# Patient Record
Sex: Male | Born: 1960 | Race: White | Hispanic: No | Marital: Married | Smoking: Never smoker
Health system: Southern US, Academic
[De-identification: ages and names within clinical notes are randomized; demographics above are authoritative.]

## PROBLEM LIST (undated history)

## (undated) DIAGNOSIS — E119 Type 2 diabetes mellitus without complications: Secondary | ICD-10-CM

## (undated) DIAGNOSIS — I10 Essential (primary) hypertension: Secondary | ICD-10-CM

## (undated) DIAGNOSIS — Z973 Presence of spectacles and contact lenses: Secondary | ICD-10-CM

## (undated) DIAGNOSIS — E785 Hyperlipidemia, unspecified: Secondary | ICD-10-CM

## (undated) HISTORY — PX: SHOULDER SURGERY: SHX246

## (undated) HISTORY — DX: Type 2 diabetes mellitus without complications: E11.9

---

## 2020-10-16 ENCOUNTER — Ambulatory Visit: Attending: Sports Medicine | Admitting: Sports Medicine

## 2020-10-16 ENCOUNTER — Ambulatory Visit (HOSPITAL_BASED_OUTPATIENT_CLINIC_OR_DEPARTMENT_OTHER)
Admission: RE | Admit: 2020-10-16 | Discharge: 2020-10-16 | Disposition: A | Discharge status: Home / Self Care | Source: Ambulatory Visit | Admitting: Radiology

## 2020-10-16 ENCOUNTER — Other Ambulatory Visit: Payer: Self-pay

## 2020-10-16 ENCOUNTER — Encounter (HOSPITAL_BASED_OUTPATIENT_CLINIC_OR_DEPARTMENT_OTHER): Payer: Self-pay | Admitting: Sports Medicine

## 2020-10-16 VITALS — BP 138/75 | HR 85 | Temp 96.8°F | Ht 72.52 in | Wt 204.1 lb

## 2020-10-16 DIAGNOSIS — M19011 Primary osteoarthritis, right shoulder: Secondary | ICD-10-CM

## 2020-10-16 DIAGNOSIS — S43439A Superior glenoid labrum lesion of unspecified shoulder, initial encounter: Secondary | ICD-10-CM | POA: Insufficient documentation

## 2020-10-16 DIAGNOSIS — M67813 Other specified disorders of tendon, right shoulder: Secondary | ICD-10-CM | POA: Insufficient documentation

## 2020-10-16 DIAGNOSIS — S43431A Superior glenoid labrum lesion of right shoulder, initial encounter: Secondary | ICD-10-CM

## 2020-10-16 DIAGNOSIS — M25511 Pain in right shoulder: Secondary | ICD-10-CM

## 2020-10-16 MED ORDER — NAPROXEN 500 MG TABLET
500.0000 mg | ORAL_TABLET | Freq: Two times a day (BID) | ORAL | 0 refills | Status: DC
Start: 2020-10-16 — End: 2020-10-20

## 2020-10-20 ENCOUNTER — Other Ambulatory Visit (HOSPITAL_BASED_OUTPATIENT_CLINIC_OR_DEPARTMENT_OTHER): Payer: Self-pay | Admitting: Sports Medicine

## 2020-11-10 NOTE — Progress Notes (Signed)
[facility]   DEPARTMENT OF ORTHOPAEDICS  HISTORY AND PHYSICAL    Patient: Craig Pierce  MRN: [id number]  Date of Service: 11/10/20  Date of Birth: June 10, 1961  Age: 60 y.o.      CHIEF COMPLAINT  Right shoulder pain    HISTORY OF PRESENT ILLNESS  Craig Pierce presents to clinic today for initial evaluation of his right shoulder.  Patient reports he slipped on ice and fell hit his back in shoulder and elbow this occurred on August 12, 2020.  Patient reports that he has pain in the area of the anterior shoulder he denies any new falls trips or injury he reports his pain is approximately a 7/10 he has not completed any physical therapy denies any taking any anti-inflammatories at this point.  He reports that he has been working light duty.    MEDICATIONS  aspirin 81 mg Oral Capsule, Take 81 mg by mouth Once a day  empagliflozin (JARDIANCE) 25 mg Oral Tablet, Take 25 mg by mouth Once a day  losartan (COZAAR) 100 mg Oral Tablet, Take 100 mg by mouth Once a day  MetFORMIN (GLUCOPHAGE) 1,000 mg Oral Tablet, Take 1,000 mg by mouth Twice daily with food  multivitamin-minerals-lutein (MULTIVITAMIN 50 PLUS) Oral Tablet, Take 1 Tablet by mouth Once a day  vit  A,C,E-Zinc-Copper (ICAPS AREDS) 14,320-226-200 unit-mg-unit Oral Capsule, Take 1 Capsule by mouth Twice daily    No facility-administered medications prior to visit.        ALLERGIES  Patient reports allergies to penicillin    PAST MEDICAL HISTORY  Patient reports past medical history significant for diabetes with an A1c of 7.3    PAST SURGICAL HISTORY  Patient reports past surgical history significant for cholecystectomy colonoscopy and plates placed in the pelvis.  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  He denies any smoking does report chewing tobacco denies alcohol use    FAMILY HISTORY  Patient reports family history significant for Crohn's disease sleep apnea diabetes high  cholesterol    REVIEW OF SYSTEMS  Constitutional: denies  fevers or night sweats  Skin: denis lesions and rashes   Cardiovascular: denies chest pain, history of arrhythmia, history of MI, history of a murmur  Respiratory: denies shortness of breath, cough  Gastrointestinal: denies abdominal pain, nausea, vomiting, or diarrhea   Genitourinary: denies incontinence, dysuria,  Neurological: denies tics, tremors, seizures   Psychiatric: denies history of depression, thoughts of harm to self or others   Vascular: denies history of a DVT, claudication,   Musculoskeletal: SEE HPI     All other systems reviewed and negative    PHYSICAL EXAMINATION   BP 138/75   Pulse 85   Temp 36 C (96.8 F) (Thermal Scan)   Ht 1.842 m (6' 0.52")   Wt 92.6 kg (204 lb 2.3 oz)   BMI 27.29 kg/m       GEN:   On physical exam is a 60 year old male with appropriate mood affect in no apparent distress  SKIN: Warm and Dry without lesions or rashes   NECK: Neck symmetrical and trachea midline.  CV:   Radial ulnar pulses 2+.   PULM:   Unlabored breathing on room air   MS:   Exam of the right shoulder patient shows no previous surgical scars there is no erythema or ecchymosis active range of motion from 0 to approximately 100 20 of forward flexion external rotation  of 30 internal rotation L4-L5 with a positive O'Brien's  NEURO: Sensation intact distally   PSYCHOSOCIAL:   Alert and Oriented to person, place and time with appropriate mood and affect.     Skin: Warm and Dry         RADIOGRAPHS  X-rays of the patient's right shoulder show no acute bony abnormality  MRI the patient's right shoulder does show SLAP tear    ASSESSMENT   Right shoulder slap tear    PLAN  At this point we explained the patient we given a course of physical therapy working on strengthening and range of motion and see how he progresses he was understanding of this and did wish to proceed in this fashion all questions concerns were answered if he has any further questions  or concerns he can feel free to call us otherwise we will see him back in 6 weeks    A shared visit was performed with the co-signing physician.      Timmie Dugue Katrinka Blazing, PA-C 11/10/2020, 08:05     E. Letitia Caul, MD  Associate Professor  [facility] Department of Orthopaedics    I personally saw and examined the patient. See mid-level's note for additional details. My findings are consistant with Right shoulder pain    SLAP tear of shoulder    Biceps tendonosis of right shoulder.    Harrison Mons, MD 11/13/2020, 07:49

## 2020-11-13 ENCOUNTER — Ambulatory Visit: Attending: Sports Medicine | Admitting: Sports Medicine

## 2020-11-13 ENCOUNTER — Encounter (HOSPITAL_BASED_OUTPATIENT_CLINIC_OR_DEPARTMENT_OTHER): Payer: Self-pay | Admitting: Sports Medicine

## 2020-11-13 ENCOUNTER — Other Ambulatory Visit: Payer: Self-pay

## 2020-11-13 VITALS — BP 139/83 | HR 85 | Temp 97.7°F

## 2020-11-13 DIAGNOSIS — S43431D Superior glenoid labrum lesion of right shoulder, subsequent encounter: Secondary | ICD-10-CM

## 2020-11-13 DIAGNOSIS — S43439A Superior glenoid labrum lesion of unspecified shoulder, initial encounter: Secondary | ICD-10-CM | POA: Insufficient documentation

## 2020-11-13 DIAGNOSIS — G252 Other specified forms of tremor: Secondary | ICD-10-CM | POA: Insufficient documentation

## 2020-11-13 NOTE — Progress Notes (Signed)
[facility]   DEPARTMENT OF ORTHOPAEDICS    PATIENT NAME:Craig Pierce  MRN:  [id number]  DOB:  24-Nov-1960  DATE: 11/13/2020    Chief Complaint:  Right shoulder SLAP tear    Subjective: Craig Pierce presents to clinic today for follow-up evaluations right shoulder.  Patient reports he has been completing physical therapy he reports that his symptoms are about the same he does not have that much in the way of anterior shoulder pain he does report occasional pain across the anterior aspect of the shoulder but his major complaint today is a tremor that occurs when he is reaching out towards objects.  Patient reports that he gets closer to his endpoint he has an increasing tremor.  He has difficulty turning on and off his CBD radial and he has difficulty with drinks as well.      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 denies any smoking chewing tobacco or alcohol use at this time    Review of Systems:  Constitutional: denies  fevers or night sweats  Skin: denies lesions and rashes   Cardiovascular: denies chest pain, history of arrhythmia, history of MI, history of a murmur  Respiratory: denies shortness of breath, cough  Gastrointestinal: denies abdominal pain, nausea, vomiting, or diarrhea   Genitourinary: denies incontinence, dysuria,  Neurological: denies tics, tremors, seizures   Psychiatric: denies history of depression, thoughts of harm to self or others   Vascular: denies history of a DVT, claudication,   Musculoskeletal: SEE HPI     Objective:  BP 139/83   Pulse 85   Temp 36.5 C (97.7 F) (Thermal Scan)       GEN:   On physical exam this is a 60 year old male with appropriate mood affect in no apparent distress  NECK: Neck symmetrical and trachea midline.  CV:  Radial ulnar pulses 2+ present bilaterally, without pedal edema.   PULM:   Unlabored breathing on room air   ABD: Non distended.  MS:  Examination the patient's right shoulder shows no previous surgical scars there is no erythema or ecchymosis present on today's exam active range of motion from 0-160 degrees of forward flexion external rotation around 30 internal rotation L4-L5 5/5 strength with external and internal rotation with a positive O'Brien's test.   He does show a clear tremor with finger-to-nose testing.  NEURO: Sensation intact distally   PSYCHOSOCIAL:   Alert and Oriented to person, place and time with appropriate mood and affect.     Skin: Warm and Dry        Assessment:   Right shoulder slap tear  Intention tremor    Plan:   At this point we explained to the patient that locked he does have a slap tear we would have him continue physical therapy for that we would like him evaluated for this intention tremor as that is new in onset we did place referral to neurology we explained this may have to be done outside of workers compensation.     A shared visit was performed with the co-signing physician.         Kaiyu Mirabal Katrinka Blazing, PA-C 11/13/2020, 15:09    E. Letitia Caul, MD  Associate Professor  [facility] Department of Orthopaedics        I personally saw  and examined the patient. See mid-level's note for additional details. My findings are consistant with SLAP tear of shoulder    Intention tremor.    Harrison Mons, MD 11/17/2020, 07:43

## 2020-11-20 ENCOUNTER — Encounter (HOSPITAL_BASED_OUTPATIENT_CLINIC_OR_DEPARTMENT_OTHER): Payer: Self-pay | Admitting: Sports Medicine

## 2020-11-24 ENCOUNTER — Encounter (HOSPITAL_BASED_OUTPATIENT_CLINIC_OR_DEPARTMENT_OTHER): Payer: Self-pay | Admitting: Sports Medicine

## 2020-12-04 ENCOUNTER — Ambulatory Visit (INDEPENDENT_AMBULATORY_CARE_PROVIDER_SITE_OTHER): Payer: Self-pay | Admitting: Neurology

## 2020-12-04 NOTE — Telephone Encounter (Signed)
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   Orthopedics is referring.  I thought it had to be from a [facility] PCP or Occ [facility].  Will we see him?    Thanks,  Lupita Leash

## 2020-12-04 NOTE — Telephone Encounter (Signed)
Spoke with Craig Pierce- pt is w/c-   Referring is ortho--Needs to be Occupational med or PCP  Tremor didn't start till after accident. Should shoulder be fixed first  Please advise

## 2020-12-07 ENCOUNTER — Ambulatory Visit (INDEPENDENT_AMBULATORY_CARE_PROVIDER_SITE_OTHER): Payer: Self-pay | Admitting: Neurology

## 2020-12-07 NOTE — Telephone Encounter (Signed)
Appt cancelled for today - wife said surgeon is going to do shoulder surgery

## 2020-12-07 NOTE — Telephone Encounter (Signed)
Tremor unlikely to be work related

## 2020-12-21 ENCOUNTER — Other Ambulatory Visit (HOSPITAL_BASED_OUTPATIENT_CLINIC_OR_DEPARTMENT_OTHER): Payer: Self-pay | Admitting: PHYSICIAN ASSISTANT

## 2020-12-21 MED ORDER — NAPROXEN 500 MG TABLET
500.0000 mg | ORAL_TABLET | Freq: Two times a day (BID) | ORAL | 0 refills | Status: DC
Start: 2020-12-21 — End: 2021-02-22

## 2021-01-06 ENCOUNTER — Other Ambulatory Visit (HOSPITAL_BASED_OUTPATIENT_CLINIC_OR_DEPARTMENT_OTHER): Payer: Self-pay | Admitting: PHYSICIAN ASSISTANT

## 2021-01-06 NOTE — Progress Notes (Addendum)
NAME: Craig Pierce  MRN: [id number]  DOB: 13-Jan-1961  DATE: 01/06/2021        [name] consulted with neurology about his new on set complaints of tremors. Neurology does not recommend any intervention at this time.He does have symptoms consistent with a slap tear and as neurology does not recommend any interventions we would recommend moving forward with a right shoulder arthroscopy with biceps tenodesis vs tenolysis.     If you have any other questions or concerns, please feel free to contact our office at [phone].      Lorisa Scheid Katrinka Blazing, PA-C 01/06/2021, 15:14    With    E. Letitia Caul, MD  Associate Professor  [facility] Department of Orthopaedics

## 2021-01-18 ENCOUNTER — Other Ambulatory Visit (HOSPITAL_BASED_OUTPATIENT_CLINIC_OR_DEPARTMENT_OTHER): Payer: Self-pay | Admitting: PHYSICIAN ASSISTANT

## 2021-01-22 ENCOUNTER — Encounter (HOSPITAL_BASED_OUTPATIENT_CLINIC_OR_DEPARTMENT_OTHER): Admitting: Sports Medicine

## 2021-01-22 ENCOUNTER — Encounter (HOSPITAL_BASED_OUTPATIENT_CLINIC_OR_DEPARTMENT_OTHER): Payer: Self-pay

## 2021-02-09 ENCOUNTER — Other Ambulatory Visit: Payer: Self-pay

## 2021-02-09 ENCOUNTER — Ambulatory Visit: Attending: Sports Medicine | Admitting: Sports Medicine

## 2021-02-09 ENCOUNTER — Inpatient Hospital Stay (HOSPITAL_BASED_OUTPATIENT_CLINIC_OR_DEPARTMENT_OTHER)
Admission: RE | Admit: 2021-02-09 | Discharge: 2021-02-09 | Disposition: A | Discharge status: Home / Self Care | Source: Ambulatory Visit

## 2021-02-09 ENCOUNTER — Other Ambulatory Visit (HOSPITAL_BASED_OUTPATIENT_CLINIC_OR_DEPARTMENT_OTHER): Payer: Self-pay | Admitting: PHYSICIAN ASSISTANT

## 2021-02-09 ENCOUNTER — Encounter (HOSPITAL_BASED_OUTPATIENT_CLINIC_OR_DEPARTMENT_OTHER): Payer: Self-pay

## 2021-02-09 ENCOUNTER — Inpatient Hospital Stay (HOSPITAL_BASED_OUTPATIENT_CLINIC_OR_DEPARTMENT_OTHER)
Admission: RE | Admit: 2021-02-09 | Discharge: 2021-02-09 | Disposition: A | Discharge status: Home / Self Care | Source: Ambulatory Visit | Attending: Sports Medicine | Admitting: Sports Medicine

## 2021-02-09 VITALS — BP 140/83 | HR 77 | Temp 96.4°F | Ht 72.56 in | Wt 204.1 lb

## 2021-02-09 DIAGNOSIS — S43431A Superior glenoid labrum lesion of right shoulder, initial encounter: Secondary | ICD-10-CM | POA: Insufficient documentation

## 2021-02-09 DIAGNOSIS — S43439A Superior glenoid labrum lesion of unspecified shoulder, initial encounter: Secondary | ICD-10-CM

## 2021-02-09 DIAGNOSIS — Z01818 Encounter for other preprocedural examination: Secondary | ICD-10-CM

## 2021-02-09 DIAGNOSIS — S43431D Superior glenoid labrum lesion of right shoulder, subsequent encounter: Secondary | ICD-10-CM

## 2021-02-09 HISTORY — DX: Essential (primary) hypertension: I10

## 2021-02-09 HISTORY — DX: Presence of spectacles and contact lenses: Z97.3

## 2021-02-09 HISTORY — DX: Hyperlipidemia, unspecified: E78.5

## 2021-02-09 HISTORY — DX: Type 2 diabetes mellitus without complications: E11.9

## 2021-02-09 LAB — POC BLOOD GLUCOSE (RESULTS): GLUCOSE, POC: 232 mg/dl — ABNORMAL HIGH (ref 70–105)

## 2021-02-09 NOTE — Patient Instructions (Signed)
[place] Department of Orthopaedics  Office Phone:  [phone]  Scheduling Phone:  [phone]    SHOULDER ARTHROSCOPY  POST-OP INSTRUCTIONS  DAY OF THE PROCEDURE:     . You are to have nothing to eat or drink after midnight they day of your procedure.   . This can prevent your case from being moved if someone cancels there procedure and can result in the cancellation of your procedure.     MEDICATION:    Take medication ONLY as prescribed.  ? Percocet (Oxycodone/ Acetaminophen):  1 tablet every 4-6 hours as needed for pain  ? Stool Softener as needed (Colace, Senokot, etc.)  ? * DO NOT take additional Tylenol/ Acetaminophen* (as this is already in pain medicine that you were prescribed)      ACTIVITY:     . Apply ice or cryo-cuff (ice machine) over the shoulder every 2 to 3 hours for 20 minutes at a time.  DO NOT place ice directly onto your skin.  Continue this for 1 week or until your pain has subsided. DO NOT use heat.    . Place a pillow behind your elbow while lying down or sleeping.  Sleeping in a              upright position (recliner) may be more comfortable initially.    Marland Kitchen Open and close your hand 10 times every hour that you are awake.  Flex and extend your wrist and elbow 10 times every hour that you are awake.  It is OK to remove your sling for elbow and wrist range of motion only.    . May do light activity around the house.  No lifting/ pushing/ pulling with operative arm.    . DRIVING:  You MAY NOT drive until your next office visit.  You will not be permitted to drive until you are off of all narcotic pain medication and your physician has discontinued use of your shoulder sling.    . DO NOT use exercise equipment unless otherwise instructed.        SHOULDER SLING:    ? Use the shoulder sling at all times and while sleeping until your next office visit. Only come out of it to work on elbow, wrist and hand motion. No lifting.       DRESSING CARE:     . Keep the dressing dry.  Continue to wear the  compression stockings for 3     weeks after the operation.    . You can expect some light bloody wound seepage through the bandage.  DO NOT BE ALARMED.  Fluid seepage is normal.  If the dressing does get soaked, remove and replace with dry gauze and a new bandage.      . Your first dressing change can be done at Physical Therapy or at home if you are comfortable doing it.  This will be performed 72 HOURS (approximately 3 days) after the surgery.  DO NOT remove the stitches or paper tape (steri-strips).  Re-apply the shoulder sling.      SHOWERING:    .   You may begin to shower 3 DAYS after the surgery once the dressing has been changed.  DO NOT soak the shoulder/ arm in a bathtub, hot tub, whirlpool, or swimming in the ocean / pool. Remove the brace/ sling and bandages for showering.  You should either sit on a chair or steady yourself with handrails. DO NOT REMOVE THE STREI STRIPS, LET THEM FALL OFF  ON THEIR OWN.      PHYSICAL THERAPY:    .  You should be seen in Physical Therapy 3 days after surgery.  TAKE YOUR PHYSICAL THERAPY PRESCRIPTION/ PROTOCOL WITH YOU TO THAT APPOINTMENT.  They will change your dressing, begin rehabilitation, and schedule further therapy sessions.  Do not perform any exercises without clearance from physical therapy.  You should contact the Physical Therapy clinic PRIOR to surgery to arrange your appointment for 3 days after surgery.  FOLLOW-UP:     . Your post-operative appointment is scheduled for .     Marland Kitchen Notify the office if you have:  1.) any fever over 101.5 degrees.  2.) Severe calf tenderness.  3.) Excessive bloody wound seepage.  4.) Numbness in the arm/ hand.

## 2021-02-09 NOTE — Progress Notes (Signed)
[facility]   DEPARTMENT OF ORTHOPAEDICS  HISTORY AND PHYSICAL    Patient: Craig Pierce  MRN: [id number]  Date of Service: 02/09/21  Date of Birth: [birth year]  Age: 60 y.o.      CHIEF COMPLAINT   right shoulder SLAP tear    HISTORY OF PRESENT ILLNESS  Craig Pierce presents to clinic today for preoperative history and physical prior to a right shoulder arthroscopy with biceps tenodesis.  Patient reports he was injured on August 12, 2020 at which time he slipped on ice fell and he has elbow and shoulder.  He reports he had immediate pain and he has continued to have anterior shoulder pain since that time he denies any new falls trips or injuries he denies any numbness or tingling he does report that he developed a tremor of his occurring when he was trying to use his CB radial or taken drink we did report him that we do not think that we will make the symptoms better but we are hoping that we will assist with any anterior shoulder pain he may have.  He has completed physical therapy and he has had no success with conservative treatment.    MEDICATIONS  aspirin 81 mg Oral Capsule, Take 81 mg by mouth Once a day  empagliflozin (JARDIANCE) 25 mg Oral Tablet, Take 25 mg by mouth Once a day  losartan (COZAAR) 100 mg Oral Tablet, Take 100 mg by mouth Once a day  MetFORMIN (GLUCOPHAGE) 1,000 mg Oral Tablet, Take 1,000 mg by mouth Twice daily with food  multivitamin-minerals-lutein (MULTIVITAMIN 50 PLUS) Oral Tablet, Take 1 Tablet by mouth Once a day  naproxen (NAPROSYN) 500 mg Oral Tablet, Take 1 Tablet (500 mg total) by mouth Twice daily with food  vit  A,C,E-Zinc-Copper (ICAPS AREDS) 14,320-226-200 unit-mg-unit Oral Capsule, Take 1 Capsule by mouth Twice daily    No facility-administered medications prior to visit.        ALLERGIES  Patient reports allergies to penicillin    PAST MEDICAL HISTORY  Patient reports past medical history significant for diabetes    PAST SURGICAL  HISTORY  Patient reports past surgical history is significant for cholecystectomy, colonoscopy, plates placed in pelvis.  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  Denies any smoking or chewing tobacco.    FAMILY HISTORY  Patient reports family history is significant for Crohn's disease, sleep apnea, diabetes, high cholesterol    REVIEW OF SYSTEMS  Constitutional: denies  fevers or night sweats  Skin: denies lesions and rashes   Cardiovascular: denies chest pain, history of arrhythmia, history of MI, history of a murmur  Respiratory: denies shortness of breath, cough  Gastrointestinal: denies abdominal pain, nausea, vomiting, or diarrhea   Genitourinary: denies incontinence, dysuria,  Neurological: denies tics, tremors, seizures   Psychiatric: denies history of depression, thoughts of harm to self or others   Vascular: denies history of a DVT, claudication,   Musculoskeletal: SEE HPI     All other systems reviewed and negative    PHYSICAL EXAMINATION   BP (!) 140/83   Pulse 77   Temp 35.8 C (96.4 F) (Thermal Scan)   Ht 1.843 m (6' 0.56")   Wt 92.6 kg (204 lb 2.3 oz)   BMI 27.26 kg/m       GEN:   On physical exam this is a 60 year old male with appropriate mood affect in no apparent distress  SKIN: Warm  and Dry without lesions or rashes   NECK: Neck symmetrical and trachea midline.  CV:   Posterior tibial and dorsal pedal pulses present bilaterally, with no pedal edema.   PULM:   Unlabored breathing on room air   MS:   On examination of the patient's right shoulder he shows no previous surgical scars there is no erythema or ecchymosis present on today's exam he has tenderness to palpation across the anterior glenohumeral joint denies any tenderness to palpation across the posterior glenohumeral joint or scapula and he has active range of motion from 0-160 degrees of forward flexion external rotation around 30 internal rotation L4-L5 with a positive O'Brien's  test.  NEURO: Sensation intact distally   PSYCHOSOCIAL:   Alert and Oriented to person, place and time with appropriate mood and affect.     Skin: Warm and Dry         RADIOGRAPHS  MRI the patient's right shoulder does show a SLAP tear.    ASSESSMENT   Right shoulder SLAP tear    PLAN  Craig Pierce is scheduled to undergo a right shoulder(s) arthroscopy with biceps tenodesis.  At today's visit, he was explained the procedure as well alternative procedures and benefits versus risks including but not limited to infection, bleeding, continued pain, stiffness, blood clots or need for further surgery.  Patient understood these risks and agreed to sign the consent form. It is my belief that the patient fully understands the procedure and risks involved with this surgery. he was instructed not to eat or drink anything after midnight the night before surgery.  As part of today's pre-operative visit we also discussed the risk of opoid addiction associated with Craig Pierce's post operative medication Percocet. We reviewed the CSMP for any current or previously prescribed medications. We discussed that this medication must be taken as prescribed. It is not to be taken with any alcohol or mood altering drugs. We discussed transitioning to an over-the-counter pain medication as quickly as possible. It was explained that should this medication be lost, stolen or damaged it will not be replaced, for this reason we explained the importance of keeping this prescription in a secure and safe location. We explained should a refill need provided this refill will deescalate in strength and or duration. Craig Pierce signed a pain contract at today's visit.   He was sent to pre-admission testing today.  All of his questions were answered in clinic.  He was not given any prescriptions at today's visit.  They were provided with their post-op instruction and a script for physical therapy to be started 3-4 days after surgery today.  We will see  Craig Pierce back for the aforementioned procedure.      A shared visit was performed with the co-signing physician.    Craig Deckman Katrinka Blazing, PA-C 02/09/2021, 13:56     E. Letitia Caul, MD  Associate Professor  [facility] Department of Orthopaedics    I personally saw and examined the patient. See mid-level's note for additional details. My findings are consistant with Superior labrum anterior-to-posterior (SLAP) tear of right shoulder.    Harrison Mons, MD 02/12/2021, 08:00

## 2021-02-09 NOTE — Anesthesia Preprocedure Evaluation (Addendum)
ANESTHESIA PRE-OP EVALUATION  Planned Procedure: ARTHROSCOPY SHOULDER (Right Shoulder)  OPEN SUBPECTORAL BICEPS TENODESIS (Right Shoulder)  Review of Systems     anesthesia history negative     patient summary reviewed          Pulmonary   Smokeless tobacco, last use w/in 24 hours,   Cardiovascular    Hypertension, well controlled, ACE / ARB inhibitor use and hyperlipidemia , Exercise Tolerance: > or = 4 METS        GI/Hepatic/Renal   negative GI/hepatic/renal ROS,         Endo/Other      type 2 diabetes/ controlled with oral medications    Neuro/Psych/MS   negative neuro/psych ROS,      Cancer    negative hematology/oncology ROS,                 Physical Assessment      Airway       Mallampati: I    TM distance: >3 FB    Neck ROM: full  Mouth Opening: fair.  Facial hair  Beard        Dental           (+) edentulous           Pulmonary    Comment: Smokeless tobacco, last use w/in 24 hours  Breath sounds clear to auscultation       Cardiovascular    Rhythm: regular  Rate: Normal       Other findings            Plan  ASA 3     Planned anesthesia type: general     general anesthesia with endotracheal tube intubation            Additional Plans: Pre-op Block      Intravenous induction       Anesthetic plan and risks discussed with patient  Signed consent obtained        Use of blood products discussed with patient who consented to blood products.     Patient's NPO status is appropriate for Anesthesia.                        Consults: medical clearance scanned into Careers information officer with labs, EKG, CXR.    Patient instructed to take the following medications day of surgery, none.    Hold metformin day prior and am of surgery    Hold jardiance 3 days prior to surgery    Hold cozaar am of procedure    Blood thinners per service instructions.  Aspirin- service inboxed, pt states he was not instructed on preop aspirin use.    Instructed to hold vitamins, herb and NSAIDs 1 week preop.    Copy of Anesthesia Consent provided to  patient or guardian to review prior to procedure. Pt instructed that consent will be signed prior to procedure with anesthesiologist.      Rayford Halsted, DO   CA-1, PGY-2, Anesthesiology  02/22/2021 10:26

## 2021-02-22 ENCOUNTER — Encounter (HOSPITAL_COMMUNITY)
Admission: RE | Disposition: A | Discharge status: Home / Self Care | Payer: Self-pay | Source: Ambulatory Visit | Attending: Sports Medicine

## 2021-02-22 ENCOUNTER — Ambulatory Visit (HOSPITAL_BASED_OUTPATIENT_CLINIC_OR_DEPARTMENT_OTHER): Admitting: Family

## 2021-02-22 ENCOUNTER — Other Ambulatory Visit (HOSPITAL_COMMUNITY)

## 2021-02-22 ENCOUNTER — Inpatient Hospital Stay
Admission: RE | Admit: 2021-02-22 | Discharge: 2021-02-22 | Disposition: A | Discharge status: Home / Self Care | Source: Ambulatory Visit | Attending: Sports Medicine | Admitting: Sports Medicine

## 2021-02-22 ENCOUNTER — Ambulatory Visit (HOSPITAL_COMMUNITY): Admitting: Family

## 2021-02-22 ENCOUNTER — Encounter (HOSPITAL_COMMUNITY): Payer: Self-pay | Admitting: Sports Medicine

## 2021-02-22 ENCOUNTER — Other Ambulatory Visit: Payer: Self-pay

## 2021-02-22 ENCOUNTER — Ambulatory Visit (HOSPITAL_COMMUNITY): Admitting: Student in an Organized Health Care Education/Training Program

## 2021-02-22 ENCOUNTER — Encounter (HOSPITAL_BASED_OUTPATIENT_CLINIC_OR_DEPARTMENT_OTHER): Admitting: Sports Medicine

## 2021-02-22 ENCOUNTER — Ambulatory Visit (HOSPITAL_BASED_OUTPATIENT_CLINIC_OR_DEPARTMENT_OTHER): Admitting: Student in an Organized Health Care Education/Training Program

## 2021-02-22 DIAGNOSIS — S43431A Superior glenoid labrum lesion of right shoulder, initial encounter: Secondary | ICD-10-CM | POA: Insufficient documentation

## 2021-02-22 DIAGNOSIS — S46011A Strain of muscle(s) and tendon(s) of the rotator cuff of right shoulder, initial encounter: Secondary | ICD-10-CM

## 2021-02-22 DIAGNOSIS — W000XXA Fall on same level due to ice and snow, initial encounter: Secondary | ICD-10-CM | POA: Insufficient documentation

## 2021-02-22 DIAGNOSIS — Y990 Civilian activity done for income or pay: Secondary | ICD-10-CM | POA: Insufficient documentation

## 2021-02-22 DIAGNOSIS — G8918 Other acute postprocedural pain: Secondary | ICD-10-CM

## 2021-02-22 DIAGNOSIS — M25511 Pain in right shoulder: Secondary | ICD-10-CM

## 2021-02-22 DIAGNOSIS — S46111A Strain of muscle, fascia and tendon of long head of biceps, right arm, initial encounter: Secondary | ICD-10-CM | POA: Insufficient documentation

## 2021-02-22 LAB — POC BLOOD GLUCOSE (RESULTS)
GLUCOSE, POC: 202 mg/dl — ABNORMAL HIGH (ref 70–105)
GLUCOSE, POC: 183 mg/dl — ABNORMAL HIGH (ref 70–105)

## 2021-02-22 SURGERY — ARTHROSCOPY SHOULDER
Anesthesia: General | Site: Shoulder | Laterality: Right | Wound class: Clean Wound: Uninfected operative wounds in which no inflammation occurred

## 2021-02-22 MED ORDER — DEXAMETHASONE SODIUM PHOSPHATE 4 MG/ML INJECTION SOLUTION
Freq: Once | INTRAMUSCULAR | Status: DC | PRN
Start: 2021-02-22 — End: 2021-02-22
  Administered 2021-02-22: 4 mg via INTRAVENOUS

## 2021-02-22 MED ORDER — CYCLOBENZAPRINE 5 MG TABLET
5.0000 mg | ORAL_TABLET | Freq: Three times a day (TID) | ORAL | 0 refills | Status: AC | PRN
Start: 2021-02-22 — End: 2021-03-01
  Filled 2021-02-22: qty 21, 7d supply, fill #0

## 2021-02-22 MED ORDER — SODIUM CHLORIDE 0.9 % (FLUSH) INJECTION SYRINGE
2.0000 mL | INJECTION | Freq: Three times a day (TID) | INTRAMUSCULAR | Status: DC
Start: 2021-02-22 — End: 2021-02-22

## 2021-02-22 MED ORDER — LACTATED RINGERS INTRAVENOUS SOLUTION
INTRAVENOUS | Status: DC
Start: 2021-02-22 — End: 2021-02-22

## 2021-02-22 MED ORDER — ROPIVACAINE (PF) 5 MG/ML (0.5 %) INJECTION SOLUTION
30.0000 mL | Freq: Once | INTRAMUSCULAR | Status: DC | PRN
Start: 2021-02-22 — End: 2021-02-22
  Administered 2021-02-22: 20 mL via INTRAMUSCULAR

## 2021-02-22 MED ORDER — FENTANYL (PF) 50 MCG/ML INJECTION SOLUTION
25.0000 ug | INTRAMUSCULAR | Status: DC | PRN
Start: 2021-02-22 — End: 2021-02-22

## 2021-02-22 MED ORDER — BACITRACIN 500 UNIT/G OINTMENT TUBE
TOPICAL_OINTMENT | Freq: Once | CUTANEOUS | Status: DC | PRN
Start: 2021-02-22 — End: 2021-02-22

## 2021-02-22 MED ORDER — SODIUM CHLORIDE 0.9 % (FLUSH) INJECTION SYRINGE
2.0000 mL | INJECTION | INTRAMUSCULAR | Status: DC | PRN
Start: 2021-02-22 — End: 2021-02-22

## 2021-02-22 MED ORDER — LIDOCAINE (PF) 100 MG/5 ML (2 %) INTRAVENOUS SYRINGE
INJECTION | Freq: Once | INTRAVENOUS | Status: DC | PRN
Start: 2021-02-22 — End: 2021-02-22
  Administered 2021-02-22 (×2): 100 mg via INTRAVENOUS

## 2021-02-22 MED ORDER — SUGAMMADEX 100 MG/ML INTRAVENOUS SOLUTION
Freq: Once | INTRAVENOUS | Status: DC | PRN
Start: 2021-02-22 — End: 2021-02-22
  Administered 2021-02-22: 200 mg via INTRAVENOUS

## 2021-02-22 MED ORDER — ONDANSETRON HCL (PF) 4 MG/2 ML INJECTION SOLUTION
Freq: Once | INTRAMUSCULAR | Status: DC | PRN
Start: 2021-02-22 — End: 2021-02-22
  Administered 2021-02-22: 4 mg via INTRAVENOUS

## 2021-02-22 MED ORDER — HYDROCODONE 5 MG-ACETAMINOPHEN 325 MG TABLET
1.0000 | ORAL_TABLET | ORAL | 0 refills | Status: AC | PRN
Start: 2021-02-22 — End: 2021-02-27
  Filled 2021-02-22: qty 30, 5d supply, fill #0

## 2021-02-22 MED ORDER — ROCURONIUM 10 MG/ML INTRAVENOUS SOLUTION
Freq: Once | INTRAVENOUS | Status: DC | PRN
Start: 2021-02-22 — End: 2021-02-22
  Administered 2021-02-22: 50 mg via INTRAVENOUS

## 2021-02-22 MED ORDER — SODIUM CHLORIDE 0.9 % IRRIGATION SOLUTION
Status: DC | PRN
Start: 2021-02-22 — End: 2021-02-22

## 2021-02-22 MED ORDER — DOCUSATE SODIUM 100 MG CAPSULE
100.0000 mg | ORAL_CAPSULE | Freq: Two times a day (BID) | ORAL | 0 refills | Status: AC | PRN
Start: 2021-02-22 — End: 2021-03-04
  Filled 2021-02-22: qty 20, 10d supply, fill #0

## 2021-02-22 MED ORDER — OXYCODONE-ACETAMINOPHEN 5 MG-325 MG TABLET
1.0000 | ORAL_TABLET | ORAL | Status: DC | PRN
Start: 2021-02-22 — End: 2021-02-22

## 2021-02-22 MED ORDER — FENTANYL (PF) 50 MCG/ML INJECTION SOLUTION
100.0000 ug | Freq: Once | INTRAMUSCULAR | Status: DC | PRN
Start: 2021-02-22 — End: 2021-02-22
  Administered 2021-02-22 (×2): 50 ug via INTRAVENOUS
  Filled 2021-02-22: qty 2

## 2021-02-22 MED ORDER — EPHEDRINE SULFATE 50 MG/ML INTRAVENOUS SOLUTION
Freq: Once | INTRAVENOUS | Status: DC | PRN
Start: 2021-02-22 — End: 2021-02-22
  Administered 2021-02-22: 5 mg via INTRAVENOUS
  Administered 2021-02-22: 10 mg via INTRAVENOUS
  Administered 2021-02-22 (×2): 5 mg via INTRAVENOUS
  Administered 2021-02-22: 10 mg via INTRAVENOUS

## 2021-02-22 MED ORDER — FENTANYL (PF) 50 MCG/ML INJECTION SOLUTION
Freq: Once | INTRAMUSCULAR | Status: DC | PRN
Start: 2021-02-22 — End: 2021-02-22
  Administered 2021-02-22: 100 ug via INTRAVENOUS

## 2021-02-22 MED ORDER — EPHEDRINE SULFATE 5 MG/ML INTRAVENOUS SOLUTION
INTRAVENOUS | Status: AC
Start: 2021-02-22 — End: 2021-02-22
  Filled 2021-02-22: qty 10

## 2021-02-22 MED ORDER — PHENYLEPHRINE 50 MG/250 ML (200 MCG/ML) IN 0.9 % SODIUM CHLORIDE IV
INTRAVENOUS | Status: DC | PRN
Start: 2021-02-22 — End: 2021-02-22
  Administered 2021-02-22: .2 ug/kg/min via INTRAVENOUS
  Administered 2021-02-22: .3 ug/kg/min via INTRAVENOUS
  Administered 2021-02-22: 0 ug/kg/min via INTRAVENOUS

## 2021-02-22 MED ORDER — FENTANYL (PF) 50 MCG/ML INJECTION SOLUTION
12.5000 ug | INTRAMUSCULAR | Status: DC | PRN
Start: 2021-02-22 — End: 2021-02-22

## 2021-02-22 MED ORDER — LACTATED RINGERS INTRAVENOUS SOLUTION
INTRAVENOUS | Status: DC
Start: 2021-02-22 — End: 2021-02-22
  Administered 2021-02-22: 0 mL via INTRAVENOUS

## 2021-02-22 MED ORDER — CLINDAMYCIN 900 MG/50 ML IN 5 % DEXTROSE INTRAVENOUS PIGGYBACK
900.0000 mg | INJECTION | Freq: Once | INTRAVENOUS | Status: AC
Start: 2021-02-22 — End: 2021-02-22
  Administered 2021-02-22: 900 mg via INTRAVENOUS
  Filled 2021-02-22: qty 50

## 2021-02-22 MED ORDER — SUGAMMADEX 100 MG/ML INTRAVENOUS SOLUTION
INTRAVENOUS | Status: AC
Start: 2021-02-22 — End: 2021-02-22
  Filled 2021-02-22: qty 2

## 2021-02-22 MED ORDER — PROPOFOL 10 MG/ML IV BOLUS
INJECTION | Freq: Once | INTRAVENOUS | Status: DC | PRN
Start: 2021-02-22 — End: 2021-02-22
  Administered 2021-02-22: 150 mg via INTRAVENOUS

## 2021-02-22 MED ORDER — PHENYLEPHRINE 10 MG/ML INJECTION SOLUTION
INTRAMUSCULAR | Status: AC
Start: 2021-02-22 — End: 2021-02-22
  Filled 2021-02-22: qty 1

## 2021-02-22 MED ORDER — SODIUM CHLORIDE 0.9 % (FLUSH) INJECTION SYRINGE
20.0000 mL | INJECTION | Freq: Once | INTRAMUSCULAR | Status: DC | PRN
Start: 2021-02-22 — End: 2021-02-22

## 2021-02-22 MED ORDER — FENTANYL (PF) 50 MCG/ML INJECTION SOLUTION
INTRAMUSCULAR | Status: AC
Start: 2021-02-22 — End: 2021-02-22
  Filled 2021-02-22: qty 2

## 2021-02-22 MED ORDER — MIDAZOLAM 1 MG/ML INJECTION SOLUTION
2.0000 mg | Freq: Once | INTRAMUSCULAR | Status: DC | PRN
Start: 2021-02-22 — End: 2021-02-22
  Administered 2021-02-22 (×2): 1 mg via INTRAVENOUS
  Filled 2021-02-22: qty 2

## 2021-02-22 SURGICAL SUPPLY — 39 items
ANCHOR SUT SWIVELOCK FRK EYLT BIOCOMP 7MMX19.5MM STRL TENODESIS REPR SYS ×1 IMPLANT
APPL 70% ISPRP 2% CHG 26ML 13._2X13.2IN CHLRPRP PREP DEHP-FR (WOUND CARE/ENTEROSTOMAL SUPPLY) ×3
APPL 70% ISPRP 2% CHG 26ML CHLRPRP HI-LT ORNG PREP STRL LF  DISP CLR (WOUND CARE SUPPLY) ×3 IMPLANT
BLADE SHAVER 13CM 4MM COOLCUT 2 CUT POWER SFT TISS RESCT STRL DISP (SURGICAL CUTTING SUPPLIES) ×1 IMPLANT
BLANKET MISTRAL-AIR ADULT LWR BODY 55.9X40.2IN FRC AIR HI VOL BLWR INTUITIVE CONTROL PNL LRG LED (MISCELLANEOUS PT CARE ITEMS) ×1 IMPLANT
BRACE ORTHO AL FBRC SHLDR ABDUCT ROT CONTROL UNIV SLING WB 1 HAND BCKL 2 (ORTHOPEDICS (NOT IMPLANTS)) ×1 IMPLANT
CANNULA ARTHRO 6MM 7CM TWIST-IN SHLDR OBTURATOR THREAD TRANSLUC STRL DISP (CANNULA) IMPLANT
CANNULA ARTHRO 8.25MM 7CM TWIST-IN SHLDR OBTURATOR THREAD TRANSLUC STRL DISP (CANNULA) ×2 IMPLANT
CANNULA TWIST-IN 8MM AR6530 (CANNULA) ×2
CONV USE 338557 - PACK SURG CUSTOM SHLDR ASCP NONST DISP LF (CUSTOM TRAYS & PACK) ×1 IMPLANT
CONV USE 48075 - SYRINGE 60ML LF  STRL LL MED (SYRN) ×1 IMPLANT
COOLER UNIT B MINI BLEDSOE_10701 (MISCELLANEOUS PT CARE ITEMS) ×1
COVER 15X18 FOOT PEDAL_DRAWSTRING BAG (EQUIPMENT MINOR) ×2 IMPLANT
DISC USE 309619 - COOLER UNIT B MINI BLEDSOE_10701 (MISCELLANEOUS PT CARE ITEMS) ×1 IMPLANT
DRAPE 2 INCS FILM ANTIMIC 23X17IN IOBN STRL SURG (PROTECTIVE PRODUCTS/GARMENTS) ×1 IMPLANT
DRAPE 2 INCS FILM ANTIMIC 23X1_7IN IOBN STRL SURG (PROTECTIVE PRODUCTS/GARMENTS) ×1
DRAPE ADH 51X47IN STRDRP LF  STRL DISP SURG CLR (PROTECTIVE PRODUCTS/GARMENTS) ×3 IMPLANT
DRESS PETRO 8X3IN CURAD GAUZE NADH NONOCCLUSIVE IMPREGNATE KNIT LF  STRL WHT (WOUND CARE SUPPLY) ×1 IMPLANT
GARMENT COMPRESS MED CALF CENTAURA NYL VASOGRAD LTWT BRTHBL SEQ FIL BLU 18- IN (ORTHOPEDICS (NOT IMPLANTS)) ×1 IMPLANT
GAUZE SURG 4X4IN STD RFDETECT COTTON 16 PLY XRY ABS LF  STRL DISP (WOUND CARE SUPPLY) ×1 IMPLANT
IMB ORTHO LRG 19.5X9IN SHLDR SLING CLS ELBOW CANVAS NONST LF  NVY (ORTHOPEDICS (NOT IMPLANTS)) ×1 IMPLANT
IMB ORTHO LRG 19.5X9IN SHLDR S_LING SWTH CLS ELBOW OPN HAND (ORTHOPEDICS (NOT IMPLANTS)) ×1
KIT INSTR BIOABS BIOTENOD DISP STRL (ORTHOPEDICS (NOT IMPLANTS)) ×1 IMPLANT
PACK SHOULDER ARTHROSCOPY_CS/1 (CUSTOM TRAYS & PACK) ×1
PACK SURG CSTM SHLDR ASCP NONST DISP LF (CUSTOM TRAYS & PACK) ×1
PAD ABDOMINAL 10X8IN LF  STRL DISP (WOUND CARE SUPPLY) ×2 IMPLANT
PAD ABDOMINAL 8X10 STRL (WOUND CARE/ENTEROSTOMAL SUPPLY) ×2
PAD THRP WRPON ERG ELAS STRAP XL UNIV FOAM SHLDR LF  PLRCR CUBE GLACIER CUB (MISCELLANEOUS PT CARE ITEMS) ×1 IMPLANT
PROBE ESURG APOLLO RF 90D ML P_RT (INSTRUMENTS) ×1
PROBE ESURG APOLLORF 90D ML PRT (SURGICAL INSTRUMENTS) ×1 IMPLANT
PUMP TUBING 13FT CONT WV III DUALWAVE ARTHRO STRL DISP (ORTHOPEDICS (NOT IMPLANTS)) IMPLANT
SOL IRRG 0.9% NACL 3L PLASTIC CONTAINR UROMATIC LF (SOLUTIONS) ×2 IMPLANT
SPONGE GAUZE 4X4IN MDCHC COTTON 12 PLY TY 7 LF  STRL DISP (WOUND CARE SUPPLY) ×1 IMPLANT
STKNT ORTHO 48X12IN IMPRV PUL TAB HOLLOW LF  STRL (ORTHOPEDICS (NOT IMPLANTS)) ×1 IMPLANT
STRAP POSITION CHIN SFT PAD SHLDR CHR DISP (POSITIONING PRODUCTS) ×1 IMPLANT
STRAP POSITION CHIN SFT PAD SH_LDR CHR DISP (POSITIONING PRODUCTS) ×1
STRIP 4X.5IN STRSTRP PLSTR REINF SKNCLS WHT STRL LF (WOUND CARE SUPPLY) ×2 IMPLANT
SYRINGE 60ML LF STRL LL MED (SYRN) ×1
TUBING IRRG 6FT DUALWAVE BKFL CK VALVE EXT STRL DISP (Connecting Tubes/Misc) IMPLANT

## 2021-02-22 NOTE — Brief Op Note (Signed)
[facility]                                                     BRIEF OPERATIVE NOTE    Patient Name: Craig, Pierce Number: [id number]  Date of Service: 02/22/2021   Date of Birth: February 01, 1961    All elements must be documented.    Pre-Operative Diagnosis: Right Shoulder SLAP Tear    Post-Operative Diagnosis: Right Shoulder SLAP Tear   Procedure(s)/Description:  S/P Right Shoulder Arthroscopy with Open Biceps Tenodesis   Findings/Complexity (inherent to the procedure performed): None     Attending Surgeon: Dr. Harrison Mons, M.D.  Assistant(s): Elwin Mocha, M.D. Dellie Burns PA-C     Anesthesia Type: General  Estimated Blood Loss:  Minimal  Blood Given: None  Fluids Given: 1L LR  Complications (not routinely expected or not inherent to difficulty/nature of procedure):None  Characteristic Event (routinely expected or inherent to the difficulty/nature of the procedure): None  Did the use of current and/or prior Anticoagulants impact the outcome of the case? N\A  Wound Class: Clean Wound: Uninfected operative wounds in which no inflammation occurred    Tubes: None  Drains: None  Specimens/ Cultures: NOne  Implants: None           Disposition: PACU - hemodynamically stable.  Condition: stable    Dellie Burns, PA-C

## 2021-02-22 NOTE — Progress Notes (Signed)
ORTHOPAEDICS POST-OPERATIVE NOTE/ DISCHARGE SUMMARY    NAME: Craig Pierce  MEDICAL RECORD [id number]  DATE OF BIRTH: 1961-05-11  DATE OF SERVICE: 02/22/2021    PROCEDURE: s/p right Shoulder Arthroscopy with Open Biceps Tenodesis     NEURO: sensation intact to light touch distally;  Patient is able to move all digits on RUE    V/S:   Filed Vitals:    02/22/21 1030 02/22/21 1045 02/22/21 1100 02/22/21 1115   BP: 118/74 113/71 126/78 119/82   Pulse: 67 62 67 58   Resp: 16 16 14 18    Temp:       SpO2: 97% 97% 97% 98%       PE: patient resting comfortably in no acute distress         dressings clean/ dry/ intact          Capillary refill less than 2 seconds in right fingers and sensation intact to light touch distally in RUE         Cryocuff secure in place    ASSESSMENT: s/p right Shoulder Arthroscopy with Open Biceps Tenodesis     PLAN: Craig Pierce will start physical therapy in 3-4 days           Patient can remove dressing and shower in 3 days           Craig Pierce will follow up in clinic 03/09/2021           Non-WB on RUE with sling           PCT has been provided for pain control            Contact office with any problems, concerns or questions           Discharge home when ready    03/11/2021, PA-C 02/22/2021, 13:21    With    E. 04/24/2021, MD  Associate Professor  [facility] Department of Orthopaedics

## 2021-02-22 NOTE — Consults (Signed)
[facility]  Acute Perioperative Pain Service Consult         Date of Service:  02/22/2021  Craig Pierce, Craig Pierce, 60 y.o. male  Encounter Start Date:  02/22/2021  Inpatient Admission Date:    Date of Birth:  1961-03-22    Plan:   Craig Pierce is a 60 y.o. male who is scheduled for PB REPAIR BICEPS LONG TENDON [23430] (ARTHROSCOPY SHOULDER)  OPEN SUBPECTORAL BICEPS TENODESIS     with anticipated post-operative pain.  - Offered patient InterScalene SS peripheral nerve block. Patient agreeable to procedure.   - Scheduled and PRN medications per primary service.   - For any questions please page or call the Acute Perioperative Pain Service at [phone].   - If not already done, please place consult order to IP Acute Perioperative Pain --RAP Thank you for your consultation.     Suggested Coding/Billing: Level 3    Type of Pain Consultation: Perioperative Pain  Consult Requested By: Johnnette Gourd, MD   Reason for Consult: Acute Postoperative Pain (ICD-10-G89.18)  Laterality right, Type of Surgery, Shoulder Arthroscopy with Open Subpectoral Biceps Tenodesis     Chief Complaint/Pain location: Right Shoulder    History of Present Illness: Craig Pierce is a 60 y.o., White male  with chief complaint as above. Anesthesiology Perioperative Pain service consulted for evaluation of R Shoulder pain. The pain is anticipated to be described as sharp post surgical pain. Pain will be located at the surgical site. Onset is anticipated to start post-operatively. Aggravating factors: movement.  Alleviating factors: pain medications.       Past Medical History:  Past Medical History:   Diagnosis Date   . Essential hypertension    . Hyperlipidemia    . Type 2 diabetes mellitus (CMS [place])     diagnosed  approx 2005, lst ha1c -7.9 done summer 2022   . Wears glasses          OSA diagnosed? no  History of Anticoagulant Use? No    Social History:   Social History     Tobacco Use   . Smoking status: Never Smoker   . Smokeless tobacco: Current  User     Types: Chew   . Tobacco comment: 1-800-quit-now   Vaping Use   . Vaping Use: Never used   Substance Use Topics   . Alcohol use: Not Currently   . Drug use: Not Currently       Past Family History:   Family Medical History:    None         ROS: Constitutional: negative for recent illnesses   Ears, nose, mouth, throat, and face: negative for nasal congestion, epistaxis, or tinnitus   Respiratory: negative for shortness of breath  Cardiovascular: negative for chest pain and dyspnea  Gastrointestinal: negative for nausea, vomiting  Integument/breast: negative for rash and skin lesion  Hematologic/lymphatic: negative for easy bruising and bleeding  Musculoskeletal: negative for neck pain  Neurological: negative for numbness and tingling  Allergy:   Allergies   Allergen Reactions   . Penicillin G          Exam:   Temperature: 36.5 C (97.7 F)  Heart Rate: 64  BP (Non-Invasive): 108/66  Respiratory Rate: (!) 8  SpO2: 97 %  General: appears in good health  HENT: Head atraumatic. normocephalic, ENT without erythema or injection, mucous membranes moist.  Neck: supple, symmetrical, trachea midline.  Lungs: Non-labored breathing.  Cardiovascular: regular rate and rhythm. Appears well perfused.   Extremities:  extremities normal, atraumatic, no cyanosis or edema  Skin: No rashes or lesions  Neurologic: Alert and oriented x3. No gross motor or sensory deficits in bilateral extremities.  Psychiatric: Normal affect, behavior, speech      Labs:  I have reviewed all lab results.            I discussed treatment plan with patient including risks and benefits of procedures and patient consents.       Gunnar Fusi, DO   CA-1, PGY-2, Anesthesiology  02/22/2021 10:29    Department of Anesthesiology  [facility] Anest[facility] Pager [phone]        I saw and examined the patient.  I reviewed the resident's note.  I agree with the findings and plan of care as documented in the resident's note.  Any exceptions/additions are  edited/noted.    Carmin Richmond, DO

## 2021-02-22 NOTE — H&P (Signed)
[facility]                                                     H&P Update Form    Keziah, Drotar, 60 y.o. male  Date of Admission:  02/22/2021  Date of Birth:  December 25, 1960    02/22/2021    Pre-Surgical H & P updated the day of the procedure.  1.  H&P completed within 30 days of surgical procedure was performed by Dr. Lewis Moccasin on 02/09/21 and has been reviewed, the patient has been examined, and no change has occured in the patients condition since the H&P was completed.     Comments: No medication changes, denies cp/sob/nvd/fevers/chills, surgical site marked, consent in chart.    2.  Patient continues to be appropiate candidate for planned surgical procedure. YES    --  Elwin Mocha, MD (PGY-5)  Department of Orthopaedics   Pager [phone]  02/22/2021, 11:02

## 2021-02-22 NOTE — OR Surgeon (Unsigned)
PATIENT NAME: Craig Pierce, Craig Pierce   HOSPITAL NUMBER:  [id number]  DATE OF SERVICE: 02/22/2021  DATE OF BIRTH:  May 27, 1961    OPERATIVE REPORT    PREOPERATIVE DIAGNOSES:  1. Right shoulder superior labral anterior and posterior tear.  2. Right shoulder biceps tendinopathy.  3. Right shoulder partial-thickness rotator cuff tear.    POSTOPERATIVE DIAGNOSIS:  1. Right shoulder superior labral anterior and posterior tear.  2. Right shoulder biceps tendinopathy.  3. Right shoulder partial-thickness rotator cuff tear.    NAME OF PROCEDURES:  1. Right shoulder arthroscopy with open biceps tenodesis.  2. Right shoulder arthroscopy with debridement of partial-thickness rotator cuff tear.  3. Right shoulder arthroscopy with debridement of superior labral tear.    SURGEON:  Harrison Mons, MD.    ASSISTANT:  Nani Ravens, MD.    ANESTHESIA:  General with interscalene block.    ESTIMATED BLOOD LOSS:  Minimal.    COMPLICATIONS:  None.    CONDITION:  Stable.    INDICATIONS FOR PROCEDURE:  The patient is a 60 year old male with a longstanding history of a work-related injury to the right shoulder resulting in anterior shoulder pain.  MRI of the right shoulder shows signal within the superior labrum as well as long head of the biceps and some articular-sided signal in the rotator cuff.  Operative versus nonoperative treatment options were discussed at length with the patient and he wished to proceed with surgical intervention.  The risks and benefits, which include but are not limited to infection, stiffness, continued pain, neurovascular injury, cosmetic deformity of the right upper arm, weakness, have all been explained at length.  The patient understands and wished to proceed with surgery.  All questions were answered prior to the procedure.    DESCRIPTION OF PROCEDURE:  The patient was brought back to the OR suite, was positioned supine on the OR table.  All pressure points were adequately padded.  Once adequate anesthesia was  obtained, the operative site was confirmed.  He was given IV antibiotics per anesthesia and carefully placed in beach chair position.  Care was taken to adequately pad all pressure points.  TEDs and SCDs were utilized on bilateral lower extremities throughout the course of the case.  The right upper extremity was then prepped and draped in the usual sterile manner.  We then injected 50 mL sterile normal saline via posterolateral approach in the glenohumeral joint to distend the joint capsule.  Standard posterolateral arthroscopy portal was made and diagnostic arthroscopy was carried out, visualized the glenoid and humeral head.  No evidence of injury to the articular cartilage or injury was noted.  There was significant fraying noted of the superior labrum from the 12 o'clock to the 3 o'clock position, with erythema and thickening of the biceps noted.  The subscap was inspected and felt to be intact, without any evidence of injury, as well as the middle glenohumeral ligament was also intact without any evidence of injury.  We forward flexed and externally rotated the shoulder and the leading edge of the rotator cuff.  There was a partial-thickness articular-sided tear making up less than 10% of the thickness.  The posterior aspect of the rotator cuff was intact without any evidence of injury.  The bare area and axillary recess showed no evidence of pathology.  We returned the scope back to the joint and established an anterosuperior portal in an outside-in fashion.  We then probed the superior labrum and not only was it frayed, but detached from the  glenoid rim at the superior aspect of the extension in the biceps and fraying of the anterior aspect down approximately to the 3 o'clock position.  We then introduced our shaver and debrided the labral tear, both the superior aspect and the anterior aspect back to stable-appearing tissue and then debrided the 10% partial-thickness articular-sided rotator cuff tear.  We  then utilized a radiofrequency ablator and proceeded to perform a tenolysis of the long head of the biceps.  Given how thick it is, it did not retract down into the bicipital groove.  At this point then, we placed the scope in the subacromial space.  There was a large amount of thickened bursa that was erythematous there.  We used a combination of radiofrequency ablator and shaver to debride this.  At this point then, we decided to perform open biceps tenodesis.  Removed all instruments from the shoulder and re-prepped the shoulder.  Once the prep had dried, we made a longitudinal incision in the deltopectoral area and raised skin flaps in a full-thickness manner medially as well as laterally.  We then found the cephalic vein and carefully retracted it and mobilized it to go laterally with the deltoid.  We placed the arm in abduction to take tension off our deltoid and carefully retracted the vein and protected it with the deltoid laterally.  We made a longitudinal incision in the bicipital groove, retrieved the tendon, which was significantly thickened and debrided the tendon so we could seat it into the socket we created and placed our guidewire for our biceps tenodesis in the socket in the anterior-posterior direction, just through 1 cortex in the bicipital groove just below the tuberosities, and proceeded to ream over this with an 8 mm reamer.  With the elbow flexed at 90 degrees, no tension placed along head of the biceps, we laid it over the socket and proceeded to fix it in place using Arthrex biceps tenodesis SwiveLock anchor.  Once the anchor was flush, we palpated this.  We then copiously irrigated our incisions and closed subcutaneous tissue with 2-0 Vicryl in a simple buried fashion and closed the skin with 4-0 Monocryl in a running subcuticular-type fashion.  Sterile dressings were applied.  The patient   went to the recovery room without any difficulty after a sling and cryo/cuff were placed.  All  counts were correct prior to skin closure.  There were no complications.        Harrison Mons, MD  Professor   [place] Department of Orthopaedics              DD:  02/22/2021 13:42:52  DT:  02/22/2021 16:55:37 LB  D#:  [id number]

## 2021-02-22 NOTE — Anesthesia Transfer of Care (Signed)
ANESTHESIA TRANSFER OF CARE   Craig Pierce is a 59 y.o. ,male, Weight: 91 kg (200 lb 9.9 oz)   had Procedure(s) with comments:  ARTHROSCOPY SHOULDER  OPEN SUBPECTORAL BICEPS TENODESIS - debridement of cuff and slap debridement/ tenodesis bicep repair  performed  02/22/21   Primary Service: Johnnette Gourd, MD    Past Medical History:   Diagnosis Date   . Essential hypertension    . Hyperlipidemia    . Type 2 diabetes mellitus (CMS [place])     diagnosed  approx 2005, lst ha1c -7.9 done summer 2022   . Wears glasses       Allergy History as of 02/22/21     PENICILLIN G       Noted Status Severity Type Reaction    10/16/20 0958 Maxwell Caul, MA 10/16/20 Active                 I completed my transfer of care / handoff to the receiving personnel during which we discussed:                                                Additional Info:Pt to RR.  Report to RN.  VSS                        Last OR Temp: Temperature: 36.1 C (97 F)  ABG:   Airway:* No LDAs found *  Blood pressure 137/73, pulse 58, temperature 36.1 C (97 F), resp. rate 20, height 1.85 m (6' 0.84"), weight 91 kg (200 lb 9.9 oz), SpO2 99 %.

## 2021-02-22 NOTE — Discharge Instructions (Signed)
SURGICAL DISCHARGE INSTRUCTIONS     Dr. McDonough, Edward, MD  performed your ARTHROSCOPY SHOULDER, OPEN SUBPECTORAL BICEPS TENODESIS today at the [facility]    [facility]:  Monday through Friday from 6 a.m. - 7 p.m.: [phone]  Between 7 p.m. - 6 a.m., weekends and holidays:  Call [facility] at [phone] or [phone].    PLEASE SEE WRITTEN HANDOUTS AS DISCUSSED BY YOUR NURSE:      SIGNS AND SYMPTOMS OF A WOUND / INCISION INFECTION   Be sure to watch for the following:   Increase in redness or red streaks near or around the wound or incision.   Increase in pain that is intense or severe and cannot be relieved by the pain medication that your doctor has given you.   Increase in swelling that cannot be relieved by elevation of a body part, or by applying ice, if permitted.   Increase in drainage, or if yellow / green in color and smells bad. This could be on a dressing or a cast.   Increase in fever for longer than 24 hours, or an increase that is higher than 101 degrees Fahrenheit (normal body temperature is 98 degrees Fahrenheit). The incision may feel warm to the touch.    **CALL YOUR DOCTOR IF ONE OR MORE OF THESE SIGNS / SYMPTOMS SHOULD OCCUR.    ANESTHESIA INFORMATION   ANESTHESIA -- ADULT PATIENTS:  You have received intravenous sedation / general anesthesia, and you may feel drowsy and light-headed for several hours. You may even experience some forgetfulness of the procedure. DO NOT DRIVE A MOTOR VEHICLE or perform any activity requiring complete alertness or coordination until you feel fully awake in about 24-48 hours. Do not drink alcoholic beverages for at least 24 hours. Do not stay alone, you must have a responsible adult available to be with you. You may also experience a dry mouth or nausea for 24 hours. This is a normal side effect and will disappear as the effects of the medication wear off.    REMEMBER   If you experience any difficulty breathing, chest  pain, bleeding that you feel is excessive, persistent nausea or vomiting or for any other concerns:  Call your physician Dr. McDonough at [phone] or [phone]. You may also ask to have the doctor on call paged. They are available to you 24 hours a day.    SPECIAL INSTRUCTIONS / COMMENTS       FOLLOW-UP APPOINTMENTS   Please call patient services at [phone] or [phone] to schedule a date / time of return. They are open Monday - Friday from 7:30 am - 5:00 pm.

## 2021-02-22 NOTE — Anesthesia Procedure Notes (Addendum)
Block: Peripheral Block    Performed By:   Authorizing Provider:  Lindie Spruce, DO  Performing Provider:  Rayford Halsted, MD  I was present and supervised/observed the entire procedure.  Lindie Spruce, DO 02/22/2021, 10:39   Sedation  The patient was continuously monitored throughout the procedure and in recovery. I was in attendance and supervised the sedation (during the start and stop times listed below) and remained immediately available until the patient returned to pre-procedure baseline.  Lindie Spruce, DO 02/22/2021, 10:39  Sedation Start Time  02/22/2021 9:58 AM   Sedation Stop Time 02/22/2021 10:17 AM  Blocks   brachial plexus Interscalene  Type of Block: single shot  Ultrasound used for needle placement, imaging, supervision, and interpretation  Image:  images available in PACS  Diagnosis: shoulder pain   Indication: Requested by surgeon and Acute post operative pain   Pt location: at Bedside  Requesting Surgeon: Harrison Mons, MD  Site verified, H&P updated and consent obtained, Patient monitors applied, Timeout performed, Emergency drugs and equipment available, Patient positioned and anesthesia consent given  Technique(See MAR for doses)       Preprocedure hand washing was performed sterile field maintained    Nerve stimulator used :with twitch faded at the current intensity of 0.44mA  Sterile Skin Prep : Sterile gloves, cap, Mask, Hand hygiene performed and Sterile technique    Skin prepped with: Chlorhexidine gluconate and isopropyl alcohol    Skin Local   lidocaine 1%   Needle  Needle type: Stimuplex     Needle Gauge: 22G   Needle length: 4 in.  Needle localization: ultrasound guidance, anatomical landmarks and nerve stimulator  Number of attempts: 1      Catheter          Site      Medications    Assessment  Injection assessment: incremental injection, local visualized surrounding nerve on ultrasound and negative aspiration for heme  Paresthesia pain: none        Events     Patient tolerance of  procedure: tolerated well, no immediate complications        NOTES: Continuous ultrasound was used to guide the needle tipin close proximity of the neural target. There was minimal disruption of musculoskeletal and vascular structures with exceptions as noted.  Appropriate hydrodissection was achieved and no intraneural injection was appreciated/occurred.  CPT [id number]    CPT Code 70340      Rayford Halsted, MD 02/22/2021  [facility]ional Anesthesiology Service  Pager [phone]  [phone]

## 2021-02-22 NOTE — Anesthesia Postprocedure Evaluation (Signed)
Anesthesia Post Op Evaluation    Patient: Craig Pierce  Procedure(s) with comments:  ARTHROSCOPY SHOULDER  OPEN SUBPECTORAL BICEPS TENODESIS - debridement of cuff and slap debridement/ tenodesis bicep repair    Last Vitals:Temperature: 36.2 C (97.2 F) (02/22/21 1430)  Heart Rate: 76 (02/22/21 1430)  BP (Non-Invasive): 125/78 (02/22/21 1430)  Respiratory Rate: 16 (02/22/21 1430)  SpO2: 96 % (02/22/21 1430)    No complications documented.    Patient is sufficiently recovered from the effects of anesthesia to participate in the evaluation and has returned to their pre-procedure level.  Patient location during evaluation: PACU       Patient participation: complete - patient participated  Level of consciousness: awake    Pain management: adequate  Airway patency: patent    Anesthetic complications: no  Cardiovascular status: acceptable  Respiratory status: spontaneous ventilation, unassisted and room air  Hydration status: acceptable  Patient post-procedure temperature: Pt Normothermic   PONV Status: Absent

## 2021-03-09 ENCOUNTER — Ambulatory Visit: Attending: Sports Medicine | Admitting: Sports Medicine

## 2021-03-09 ENCOUNTER — Other Ambulatory Visit: Payer: Self-pay

## 2021-03-09 ENCOUNTER — Encounter (HOSPITAL_BASED_OUTPATIENT_CLINIC_OR_DEPARTMENT_OTHER): Payer: Self-pay | Admitting: Sports Medicine

## 2021-03-09 DIAGNOSIS — Z9889 Other specified postprocedural states: Secondary | ICD-10-CM | POA: Insufficient documentation

## 2021-03-09 NOTE — Progress Notes (Signed)
[facility]  DEPARTMENT OF [place], [place] [address]    Service: Sports Medicine  Attending: Dr. Lewis Moccasin  Progress Note  03/09/2021      Name: Craig Pierce  DOB: [birth year]  MRN: [id number]    DOS/DOI  - 02/22/21 = Right shoulder arthroscopy with open biceps tenodesis, debridement of partial-thickness rotator cuff tear, debridement of superior labral tear.     Worker's Compensation:   OMA0045    SUBJECTIVE:  60 y.o. male who is here for his 2 week follow up for the above mentioned procedure. He is doing well. He endorses 0/10 pain at this time. He endorses soreness on days he goes to PT. He goes to formal PT twice a week. 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 He is going to reach out to his neurologist for his tremor. His anterior shoulder pain has been significantly reduced with the above procedure. He denies numbness or tingling. No other issues at this time.     OBJECTIVE:  There were no vitals taken for this visit.  RUE:  -- Inspection: Incision clean and dry without erythema, fluctuance, induration, dehiscence, or active drainage, Compartments soft and compressible.   -- Sensation: SILT median, radial, and ulnar nerve at the level of the hand , SILT grossly to axillary nerve distribution at the shoulder  -- Motor: + OK sign (AIN), + thumbs up (PIN), + finger abduction and adduction (ulnar), Fires deltoid  --CV: 2+ radial pulse, WWP, BCR all digits.    Right elbow:  Full ROM in flexion and extension    IMAGING:  None taken.     ASSESSMENT:  60 y.o. male s/p Right shoulder arthroscopy with open biceps tenodesis, debridement of partial-thickness rotator cuff tear, debridement of superior labral tear. He is going to continue formal PT. We will reassess his work readiness when he returns for a follow up visit in four weeks.     PLAN:  - PT: continue  - Sling for comfort   - Scar massage  - Sutures cut flush today  - Work note sent  - Follow-up:  RTC in 4 weeks  - All questions answered and patient is in agreement with the plan. Patient knows to call should they have any further questions or concerns.     Eduard Roux MD MPH  Resident, PGY-1  03/09/2021 13:08         I saw and examined the patient.  I reviewed the resident's note.  I agree with the findings and plan of care as documented in the resident's note.  Any exceptions/additions are edited/noted.    Harrison Mons, MD

## 2021-04-06 ENCOUNTER — Ambulatory Visit: Attending: Sports Medicine | Admitting: Sports Medicine

## 2021-04-06 ENCOUNTER — Other Ambulatory Visit: Payer: Self-pay

## 2021-04-06 ENCOUNTER — Encounter (HOSPITAL_BASED_OUTPATIENT_CLINIC_OR_DEPARTMENT_OTHER): Payer: Self-pay | Admitting: Sports Medicine

## 2021-04-06 DIAGNOSIS — Z9889 Other specified postprocedural states: Secondary | ICD-10-CM | POA: Insufficient documentation

## 2021-04-06 DIAGNOSIS — M67813 Other specified disorders of tendon, right shoulder: Secondary | ICD-10-CM | POA: Insufficient documentation

## 2021-04-06 NOTE — Progress Notes (Unsigned)
PATIENT NAME: Craig Pierce, Craig Pierce   HOSPITAL NUMBER:  [id number]  DATE OF SERVICE: 04/06/2021  DATE OF BIRTH:  Oct 14, 1960    PROGRESS NOTE    Group number:  [id number].   Date of Injury:  August 12, 2020.   Workers' Electronics engineer number:  [id number].    SUBJECTIVE:  Craig Pierce is a 60 year old male here for a postoperative visit regarding his right-sided biceps tenodesis with debridement of partial thickness rotator cuff tear and debridement of superior labral tear that he had on February 22, 2021.  At this time, he only complains of mild pain.  He is not taking any pain medications.  He has been working very hard with physical therapy and working on his exercises at home, including wall walks.  He denies numbness and paresthesias.  Denies any problems with his incision.  He has not been using a sling and he said he does not really feel many limitations in his life at this point.  He denies fevers and chills as well as incisional issues.    OBJECTIVE:  General:  Well-appearing male in no acute distress.   MSK:   Right upper extremity is neurovascularly intact with sensation to the axillary, ulnar, median, and radial distributions.  There are arthroscopy incisions as well as biceps tenodesis incision over the anterior shoulder which appear clean, dry, and intact with a good mobile scar.  He has range of motion as follows of the right shoulder:  Forward flexion to 150 degrees, abduction to about 100 degrees, external rotation of 60 degrees, and internal rotation at 30 degrees.  At this time, he has good active flexion and extension of his elbow as well as good rotator cuff strength that I would rate at 4/5 with empty can testing as well as internal and external rotation when compared to the left side.  He has a 2+ radial pulse.    ASSESSMENT:  Craig Pierce is a 60 year old male now 6 weeks status post right biceps tenodesis labral debridement and partial rotator cuff tear debridement.    PLAN:  He was given a new physical  therapy script today to continue working on biceps tenodesis exercises as well as rotator cuff stabilization and scapular stabilization exercises.  He can continue working on scar massage with whatever moisturizer he chooses.  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  We will see him back for his third postoperative visit in another 6 weeks.  He knows to give our office a call if he has any issues.  He exhibited understanding.  All his questions were answered.        Rebeca Allegra, MD      Harrison Mons, MD  Professor   [facility] Department of Orthopaedics            CC:   [employer]   [id number]   [place], [place] [address]       DD:  04/06/2021 14:04:03  DT:  04/06/2021 16:30:44 DG  D#:  [id number]    I saw and examined the patient.  I reviewed the resident's note.  I agree with the findings and plan of care as documented in the resident's note.  Any exceptions/additions are edited/noted.    Harrison Mons, MD

## 2021-04-13 ENCOUNTER — Encounter (HOSPITAL_BASED_OUTPATIENT_CLINIC_OR_DEPARTMENT_OTHER): Payer: Self-pay

## 2021-04-13 ENCOUNTER — Encounter (HOSPITAL_BASED_OUTPATIENT_CLINIC_OR_DEPARTMENT_OTHER): Payer: Self-pay | Admitting: Sports Medicine

## 2021-05-24 ENCOUNTER — Ambulatory Visit: Attending: Neurology | Admitting: Neurology

## 2021-05-24 ENCOUNTER — Encounter (INDEPENDENT_AMBULATORY_CARE_PROVIDER_SITE_OTHER): Payer: Self-pay | Admitting: Neurology

## 2021-05-24 ENCOUNTER — Other Ambulatory Visit: Payer: Self-pay

## 2021-05-24 DIAGNOSIS — S43439A Superior glenoid labrum lesion of unspecified shoulder, initial encounter: Secondary | ICD-10-CM | POA: Insufficient documentation

## 2021-05-24 DIAGNOSIS — W19XXXA Unspecified fall, initial encounter: Secondary | ICD-10-CM

## 2021-05-24 DIAGNOSIS — G252 Other specified forms of tremor: Secondary | ICD-10-CM | POA: Insufficient documentation

## 2021-05-24 NOTE — Progress Notes (Signed)
[facility]  Outpatient History and Physical    Date:  05/24/2021  Name: Craig Pierce  Age:  60 y.o.  Referring Physician:     Johnnette Gourd, [address]  [place],  [place] [id number]    History of Present Illness  History obtained from:  patient and spouse    Rainen Vanrossum is a 60 y.o. male who presents with a chief complaint of   Chief Complaint   Patient presents with   . Tremors        The patient reports:         Tremor  1 year  R arm  Worse when goes to do something    No FH tremor    Has had a couple of falls    He relates to fall     here for one time worker's comp  Visit    Fatigue worsens worsens    No effect caffeine or stress noted      Past Medical History  Current Outpatient Medications   Medication Sig   . aspirin 81 mg Oral Capsule Take 81 mg by mouth Once a day   . empagliflozin (JARDIANCE) 25 mg Oral Tablet Take 25 mg by mouth Once a day   . losartan (COZAAR) 100 mg Oral Tablet Take 100 mg by mouth Once a day   . MetFORMIN (GLUCOPHAGE) 1,000 mg Oral Tablet Take 1,000 mg by mouth Twice daily with food   . multivitamin-minerals-lutein (MULTIVITAMIN 50 PLUS) Oral Tablet Take 1 Tablet by mouth Once a day   . vit  A,C,E-Zinc-Copper (ICAPS AREDS) 14,320-226-200 unit-mg-unit Oral Capsule Take 1 Capsule by mouth Twice daily     Allergies   Allergen Reactions   . Penicillin G      Past Medical History:   Diagnosis Date   . Diabetes mellitus, type 2 (CMS HCC)    . Essential hypertension    . Hyperlipidemia    . Type 2 diabetes mellitus (CMS [place])     diagnosed  approx 2005, lst ha1c -7.9 done summer 2022   . Wears glasses          Past Surgical History:   Procedure Laterality Date   . GALLBLADDER SURGERY     . HX OTHER      pelvic fracture   . ORIF HIP FRACTURE Right    . SHOULDER SURGERY Right          Family Medical History:     Problem Relation (Age of Onset)    Healthy Mother, Father          Social History     Socioeconomic History   . Marital status: Married   .  Number of children: 3   . Years of education: 12   Occupational History   . Occupation: truck Geophysicist/field seismologist   Tobacco Use   . Smoking status: Never   . Smokeless tobacco: Current     Types: Chew   . Tobacco comments:     1-800-quit-now   Vaping Use   . Vaping Use: Never used   Substance and Sexual Activity   . Alcohol use: Not Currently   . Drug use: Not Currently   Other Topics Concern   . Ability to Walk 1 Flight of Steps without SOB/CP Yes   . Routine Exercise No   . Ability to Walk 2 Flight of Steps without SOB/CP Yes   . Ability To Do Own ADL's Yes  Review of Systems    Constitutional: negative  Eyes: positive for contacts/glasses  Ears, nose, mouth, throat, and face: negative  Respiratory: negative  Cardiovascular: negative  Gastrointestinal: negative  Genitourinary:negative  Integument/breast: negative  Hematologic/lymphatic: negative  Musculoskeletal:negative  Neurological: see HPI  Behavioral/Psych: negative  Endocrine: DM  7s  Allergic/Immunologic: negative      Examination:    Vitals: BP (!) 149/70   Pulse 92   Ht 1.829 m (6')   Wt 86.6 kg (190 lb 14.7 oz)   BMI 25.89 kg/m       General: appears in good health  Ophthalmoscopic: normal w/o hemorrhages, exudates, or papilledema  Carotids:Carotids normal without bruit  Orientation: Alert and oriented x 3  Memory: Registration, Recall, and Following of commands is normal  Attention: Attention and Concentration are normal  Knowledge: Good  Language: Normal  Speech: Normal  Cranial nerves: Cranial nerves 2-12 are normal  Gait:: Normal  Coordination: Coordination is normal without tremor  Sensory: Sensory exam in the upper and lower extremities is normal  Muscle tone: Upper and lower muscle tone is normal  Motor strength:  Motor strength is normal throughout.  Reflexes: hypoactive    I have reviewed the following: images and internal records    Assessment and Plan    1. Intention tremor    2. SLAP tear of shoulder          Intention tremor is not work or  injury related    RTC 4 months      Adelene Idler, MD 05/24/2021, 09:55

## 2021-05-25 ENCOUNTER — Ambulatory Visit: Attending: Sports Medicine | Admitting: Sports Medicine

## 2021-05-25 ENCOUNTER — Encounter (HOSPITAL_BASED_OUTPATIENT_CLINIC_OR_DEPARTMENT_OTHER): Payer: Self-pay | Admitting: Sports Medicine

## 2021-05-25 ENCOUNTER — Other Ambulatory Visit: Payer: Self-pay

## 2021-05-25 DIAGNOSIS — Z09 Encounter for follow-up examination after completed treatment for conditions other than malignant neoplasm: Secondary | ICD-10-CM

## 2021-05-25 DIAGNOSIS — Z9889 Other specified postprocedural states: Secondary | ICD-10-CM | POA: Insufficient documentation

## 2021-05-25 NOTE — Progress Notes (Signed)
Storm Sovine  DOB: Jun 19, 1961  MRN: [id number]  DOS: 05/25/2021    Involved area: right SHOULDER    Therapeutic Exercise:  Patient was educated on appropriate strengthening and ROM exercises for his right shoulder for rotator cuff.  At this time, strengthening therapeutic exercises were introduced, per physician protocol.  Shoulder extension, flexion, internal rotation, external rotation, and early scapular stabilization strengthening are the focus of this phase.  Additionally, ROM exercises were reviewed.      Exercises were explained and demonstrated to patient.  Patient verified that he understood the therapeutic exercises, including limitations, proper form, sets, reps, frequency, and goals. Written instructions were given to patient in the form of the Rotator Cuff Home Exercise Plan packet. The patient was provided with Theraband in the following color(s): red, green., blue, black.    Questions were encouraged and all questions were answered.     Goals for therapeutic exercises are as follows:  1) Continue pain-free ROM while developing strength through resistive shoulder flexion, extension, internal rotation, external rotation, and scapular stabilization.   2) Progressively increase to next level of resistance in Theraband    Burnice Logan, MS ATC, LAT  Certified Athletic Trainer

## 2021-05-25 NOTE — Progress Notes (Signed)
PATIENT NAME: Craig Pierce, Craig Pierce   HOSPITAL NUMBER:  [id number]  DATE OF SERVICE: 05/25/2021  DATE OF BIRTH:  [birth year]    PROGRESS NOTE    DATE OF SURGERY:  February 22, 2021.    PROCEDURE:  Right biceps tenodesis.    SUBJECTIVE:  Craig Pierce is a 60 year old male 3 months status post the above procedure.  He has been doing well.  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  He has no pain in his right shoulder except when he is doing cross-body adduction.  He has no numbness or paresthesias.  He finished physical therapy last week and is not really sure if he should be doing other exercises going forward.    OBJECTIVE:  On physical exam of the right upper extremity, he has normal sensation radial ulnar median distribution.  Able make a thumbs up, A-OK, cross and abduct his fingers.  Radial pulse 2+.  All of his fingers are warm and well perfused.  He has symmetric forward flexion when compared to the contralateral side.  Symmetric abduction as well as internal and external rotation.  Cross-body abduction does cause a little bit of pain, but not at the [facility] joint, more over the bicipital groove.  His surgical incisions are healed well.  No signs of infection.    IMAGING:  None.    ASSESSMENT:  This is a 60 year old male 3 months status post the above procedure.    PLAN:  We discussed with Mr. Lizak that he may have some lingering soreness in the anterior aspect of his right shoulder for several months and this is to be expected.  He was shown some rotator cuff exercises by   our athletic trainer today and he should continue to do those to maximize his improvement after surgery.  All of his questions were answered.  We will see him back in 6 weeks.        Glendora Score, MD      Harrison Mons, MD  Professor   [facility] Department of Orthopaedics              DD:  05/25/2021 16:26:05  DT:  05/25/2021 20:58:41 LA  D#:  [id number]      I saw and examined the patient.  I reviewed the resident's note.  I agree with the findings  and plan of care as documented in the resident's note.  Any exceptions/additions are edited/noted.    Harrison Mons, MD

## 2021-07-02 ENCOUNTER — Other Ambulatory Visit: Payer: Self-pay

## 2021-07-02 ENCOUNTER — Ambulatory Visit: Attending: Sports Medicine | Admitting: Sports Medicine

## 2021-07-02 ENCOUNTER — Encounter (HOSPITAL_BASED_OUTPATIENT_CLINIC_OR_DEPARTMENT_OTHER): Payer: Self-pay | Admitting: Sports Medicine

## 2021-07-02 VITALS — BP 132/83 | HR 77 | Temp 97.7°F

## 2021-07-02 DIAGNOSIS — Z4789 Encounter for other orthopedic aftercare: Secondary | ICD-10-CM

## 2021-07-02 DIAGNOSIS — Z9889 Other specified postprocedural states: Secondary | ICD-10-CM | POA: Insufficient documentation

## 2021-07-02 NOTE — Progress Notes (Signed)
PATIENT NAME: Craig Pierce   HOSPITAL NUMBER:  [id number]  DATE OF SERVICE: 07/02/2021  DATE OF BIRTH:  May 03, 1961    PROGRESS NOTE    DATE OF SURGERY:  February 22, 2021, status post right shoulder arthroscopy.    SUBJECTIVE:  Craig Pierce presents to clinic today for followup evaluation of his right shoulder.  The patient reports that his right shoulder has been progressing since the time of his previous procedure.  He has had no new falls, trips or injuries.  He denies having numbness or tingling at this time.  He does report that his symptoms have been progressing.  He does have some continued episodes of shoulder discomfort when he is driving for a long period of time, so he does need to adjust his arm frequently, but in general he reports that he feels like he is progressing positively.    OBJECTIVE:  On physical exam, this is a 60 year old male with appropriate mood and affect in no apparent distress.  On examination of the patient's right shoulder, it shows well-healed previous surgical incisions.  There is no erythema or ecchymosis present on today's exam with active range of motion from 0 to approximately 160 degrees of forward flexion, external rotation 30 degrees, internal rotation to L4-L5, 5/5 strength with external and internal rotation.    ASSESSMENT:  Status post right shoulder arthroscopy with biceps tenodesis.    PLAN:  At this point, I explained to the patient that we would have him continue with all of his activities as he tolerates moving back into everything without restrictions.  If he has any further questions or concerns, he can feel free to call us.  Otherwise, we will see him back on as-needed basis.    A shared visit was performed with the co-signing physician.      Craig Maggie Schwalbe, PA-C      Craig Mons, MD  Professor   [facility] Department of Orthopaedics        I personally saw and examined the patient. See mid-level's note for additional details. My findings are consistant with S/P  arthroscopy of right shoulder.    Craig Mons, MD 07/06/2021, 07:27        DD:  07/02/2021 13:28:42  DT:  07/02/2021 17:08:44 DW  D#:  [id number]

## 2021-09-24 ENCOUNTER — Ambulatory Visit (INDEPENDENT_AMBULATORY_CARE_PROVIDER_SITE_OTHER): Payer: Self-pay | Admitting: Neurology

## 2021-10-12 IMAGING — MR MRI BRAIN WITHOUT AND WITH CONTRAST
6 of 11 series · 24 of 48 positions shown · IV contrast (gadavist)
Comparison: None available.

﻿EXAM:  MRI BRAIN WITHOUT AND WITH CONTRAST
INDICATION: Giddiness and difficulty walking.
TECHNIQUE: Multiplanar multisequential MRI of the brain was performed without and with 10 mL of Gadavist.

[Series 6: DWI · axial · 5.0mm · 1.35mm/px · z∈[-54,+72]mm · 3 of 22 slices shown (1 of 2)]
[im 1/22]
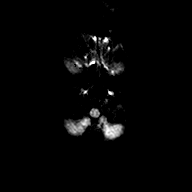
[im 11/22]
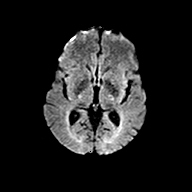
[im 22/22]
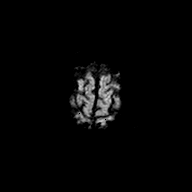

[Series 7: DWI · axial · 5.0mm · 1.35mm/px · z∈[-54,+72]mm · 3 of 22 slices shown (2 of 2)]
[im 1/22]
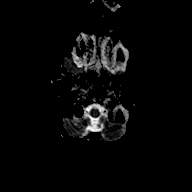
[im 11/22]
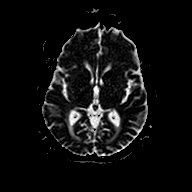
[im 22/22]
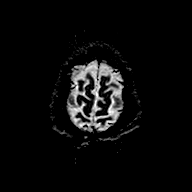

[Series 9: T2 · axial · 5.0mm · 0.43mm/px · z∈[-61,+83]mm · 5 of 25 slices shown (1 of 2)]
[im 1/25]
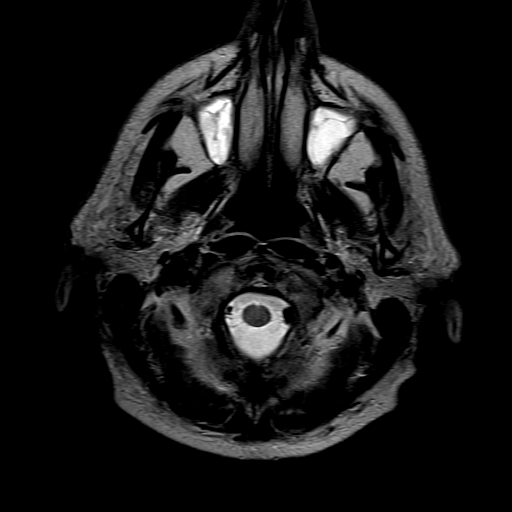
[im 7/25]
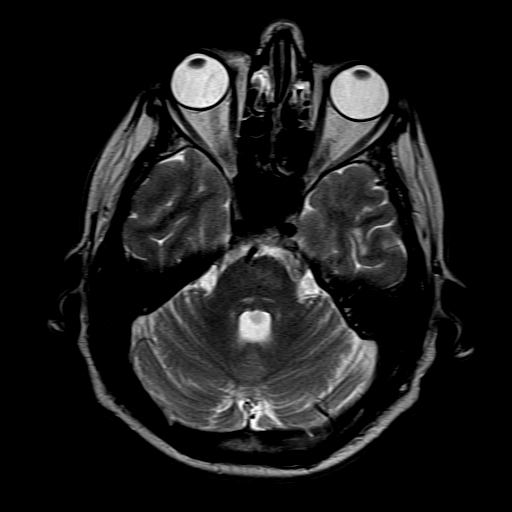
[im 13/25]
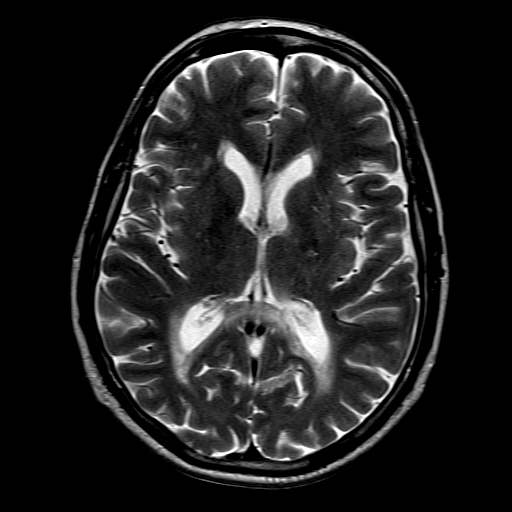
[im 19/25]
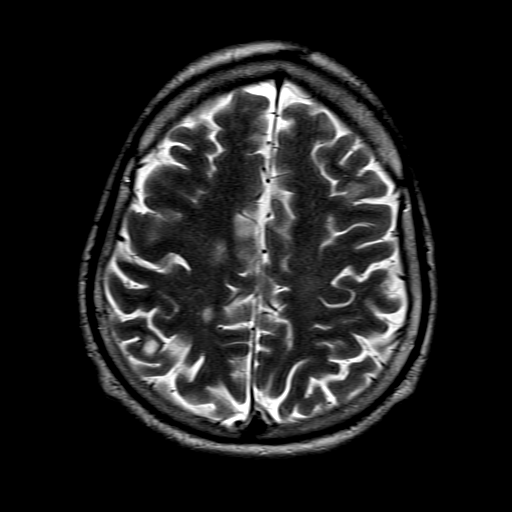
[im 25/25]
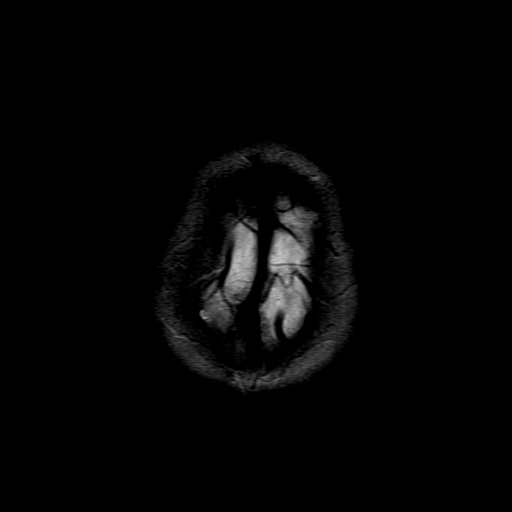

[Series 10: FLAIR · axial · 5.0mm · 0.43mm/px · z∈[-61,+83]mm · 5 of 25 slices shown]
[im 1/25]
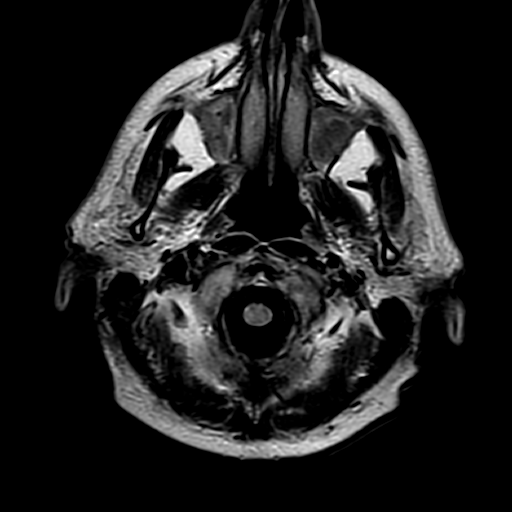
[im 7/25]
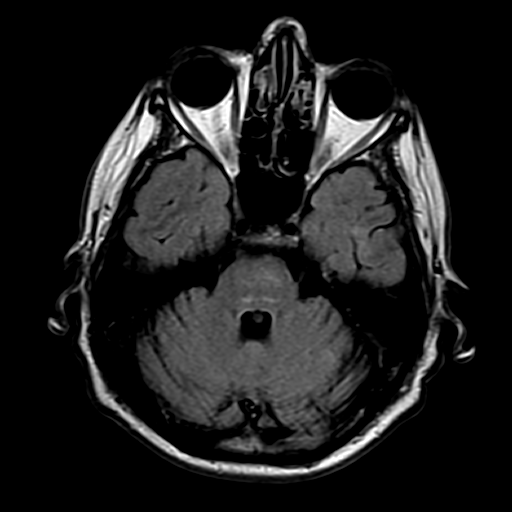
[im 13/25]
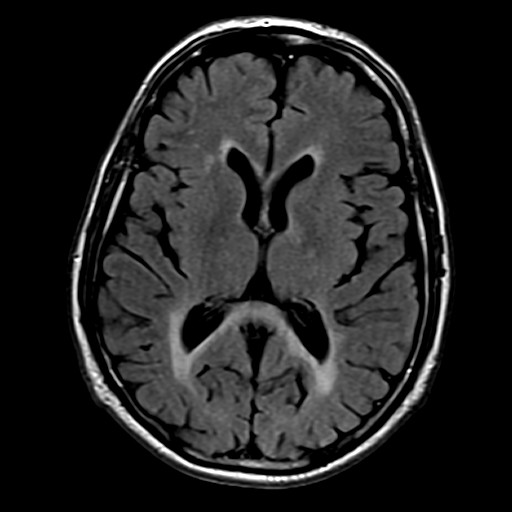
[im 19/25]
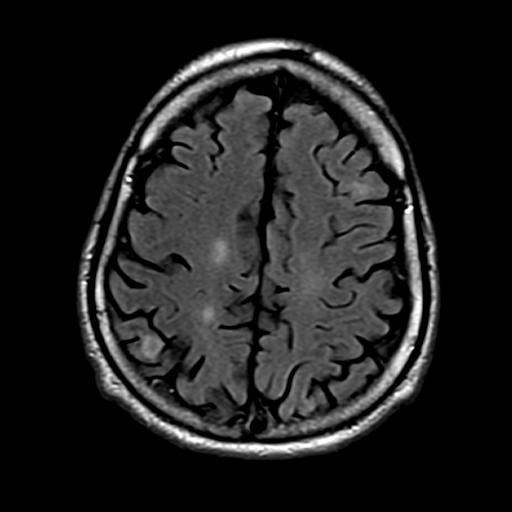
[im 25/25]
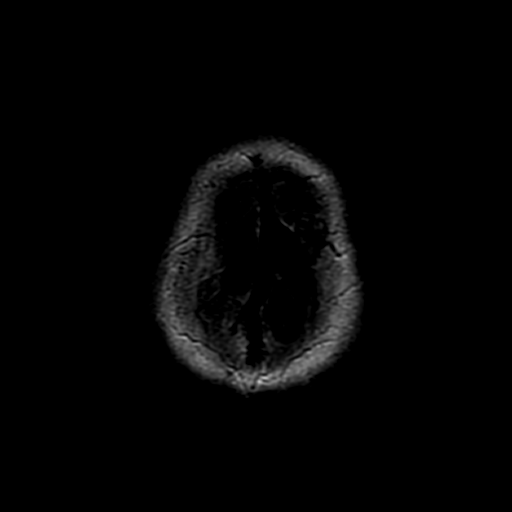

[Series 11: T1 · axial · 5.0mm · 0.43mm/px · z∈[-61,+47]mm · 4 of 25 slices shown]
[im 1/25]
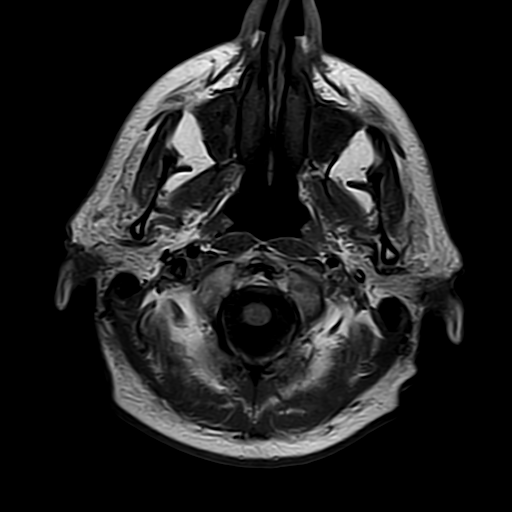
[im 7/25]
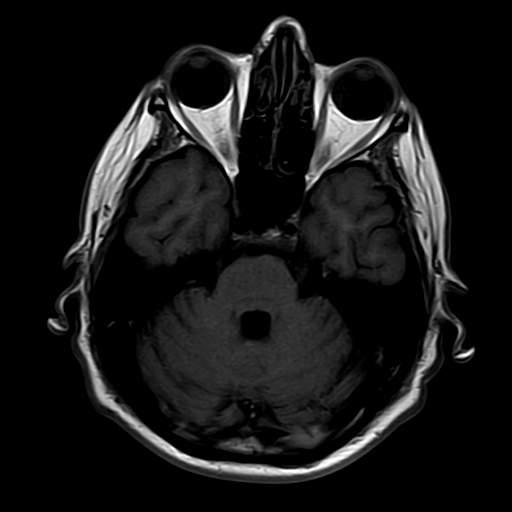
[im 13/25]
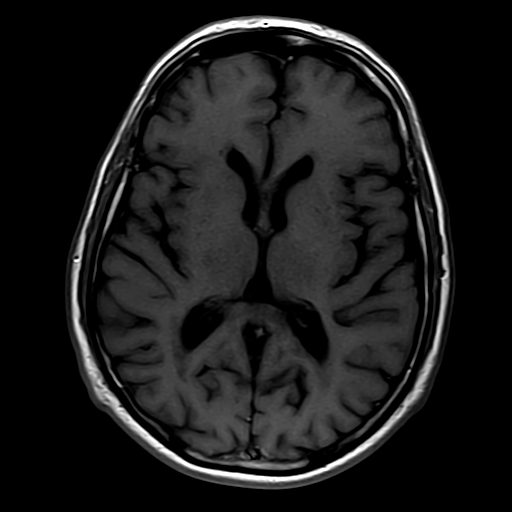
[im 19/25]
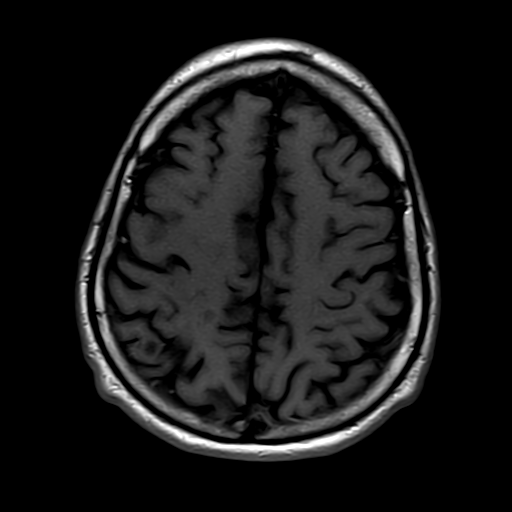

[Series 13: T2 · coronal · 6.0mm · 0.43mm/px · 4 of 24 slices shown (2 of 2)]
[im 1/24]
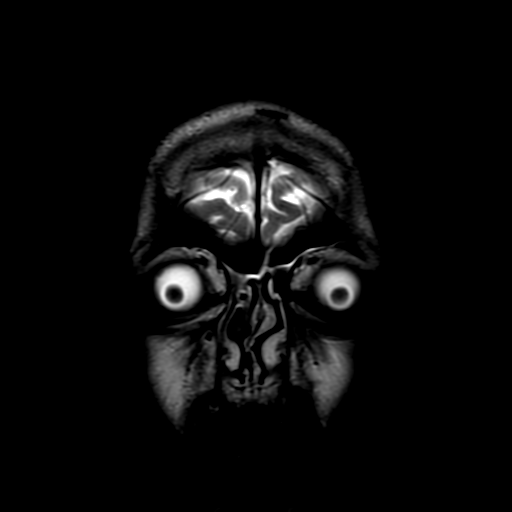
[im 8/24]
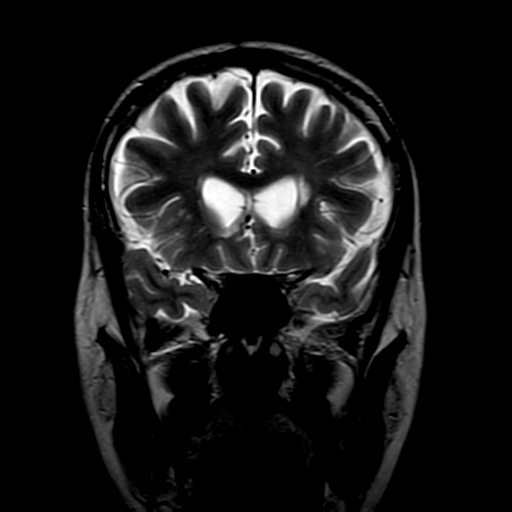
[im 16/24]
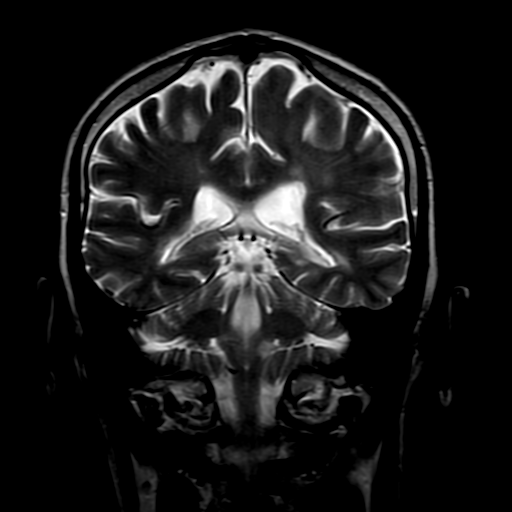
[im 24/24]
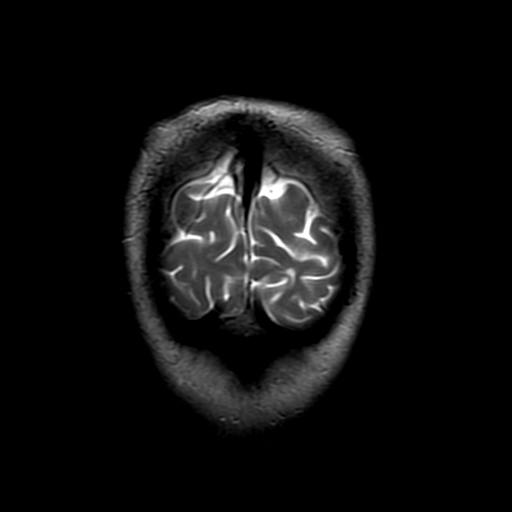

[24 of 48 positions shown; findings below may reference images not displayed]

FINDINGS: Ventricular and sulcal size is normal for the patient's age.  There are 7-10 foci of abnormal T2/FLAIR signal hyperintensity, predominantly within the periventricular deep white matter of each cerebral hemisphere.  2-3 similar lesions are also noted within the subcortical white matter of each cerebral hemisphere. Largest lesion within the left cerebral hemisphere measures 2.3 cm within centrum semiovale and largest on the right side measures 1.9 cm, also within centrum ovale. There is also involvement of the corpus callosum.  There is no mass effect, midline shift or intracranial hemorrhage. There is no evidence of acute infarction or prior microhemorrhages. Skull base flow voids and basal cisterns are patent.  Sagittal survey of midline structures is unremarkable. Following intravenous contrast administration, there is no abnormal parenchymal or leptomeningeal enhancement.  There are no extra-axial fluid collections.  Moderate mucoperiosteal thickening is seen within the maxillary sinuses bilaterally.  Mastoid air cells and orbital contents are unremarkable.
IMPRESSION: 1. Imaging findings highly suggestive of multiple sclerosis. No definite evidence of active demyelination.  

 2. No abnormal parenchymal or leptomeningeal enhancement.

## 2021-11-16 IMAGING — MR MRI CERVICAL SPINE WITHOUT AND WITH CONTRAST
7 of 8 series · 36 of 48 positions shown · IV contrast (gadavist)
Comparison: None available.

﻿EXAM:  [id number]   MRI CERVICAL SPINE WITHOUT AND WITH CONTRAST
INDICATION: History of multiple sclerosis.
TECHNIQUE: Multiplanar multisequential MRI of the cervical spine was performed without and with 10 mL of Gadavist.

[Series 5: T2 · sagittal · 3.0mm · 0.78mm/px · 3 of 11 slices shown (1 of 2)]
[im 1/11]
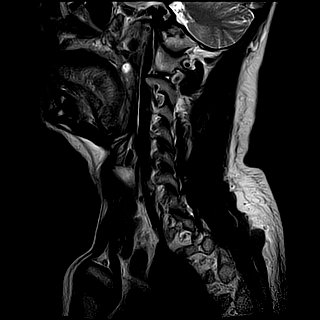
[im 6/11]
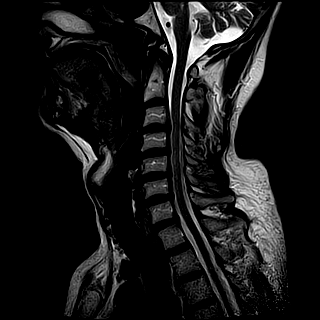
[im 11/11]
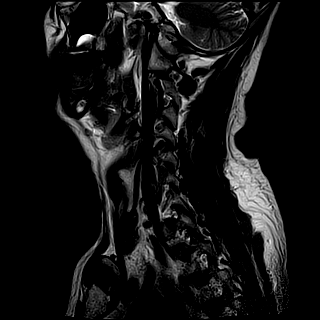

[Series 6: T1 · sagittal · 3.0mm · 0.49mm/px · 4 of 11 slices shown]
[im 1/11]
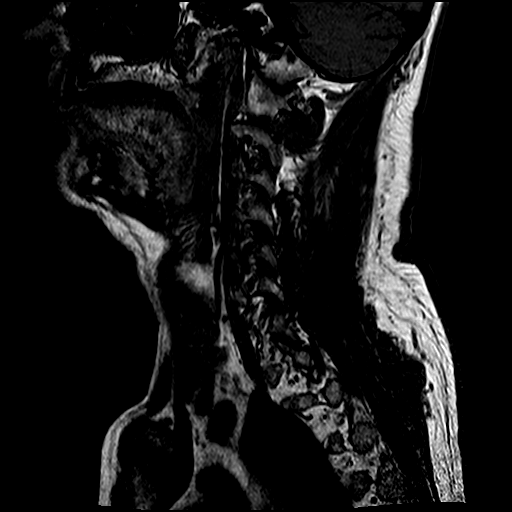
[im 4/11]
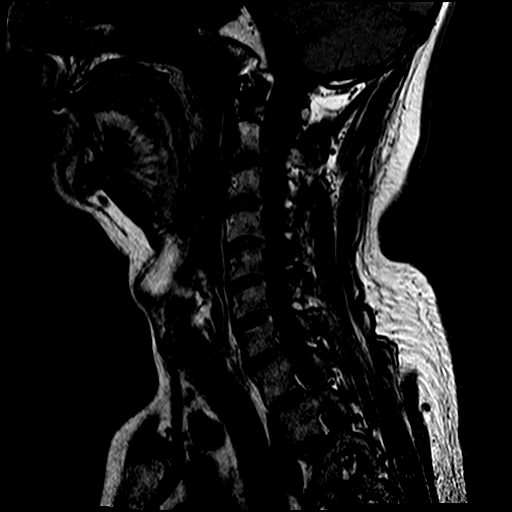
[im 7/11]
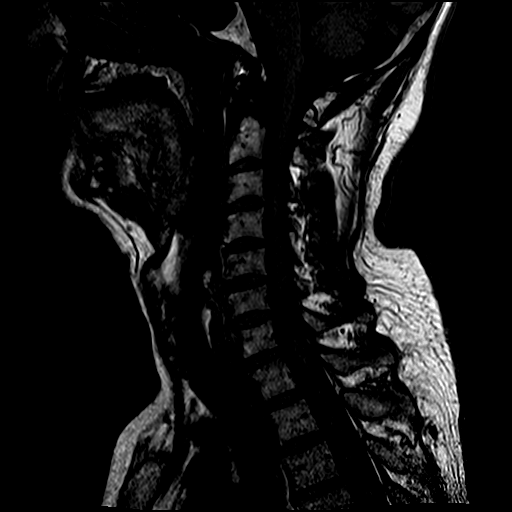
[im 11/11]
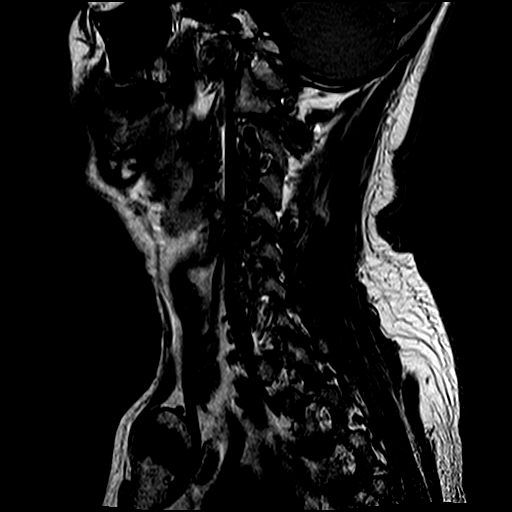

[Series 7: T1 fat-sat · sagittal · 3.0mm · 0.78mm/px · 4 of 11 slices shown (1 of 2)]
[im 1/11]
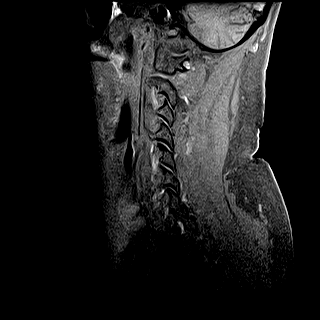
[im 4/11]
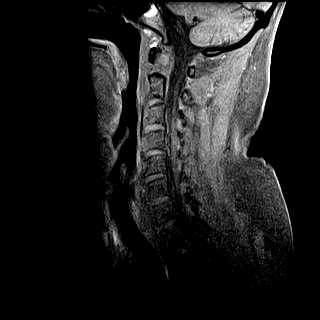
[im 7/11]
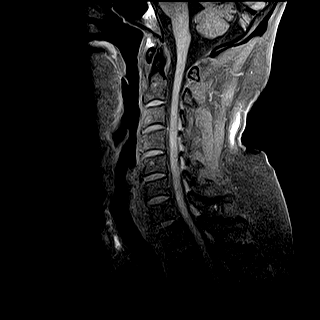
[im 11/11]
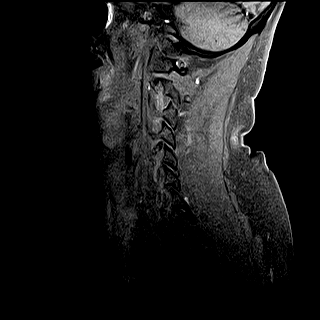

[Series 10: T1 fat-sat · axial · 4.0mm · 0.70mm/px · z∈[-99,-1]mm · 8 of 26 slices shown (2 of 2)]
[im 1/26]
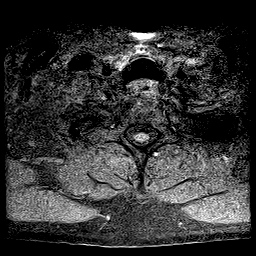
[im 3/26]
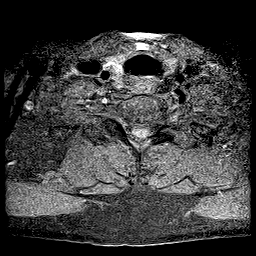
[im 9/26]
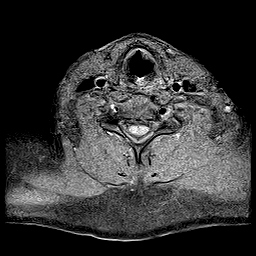
[im 12/26]
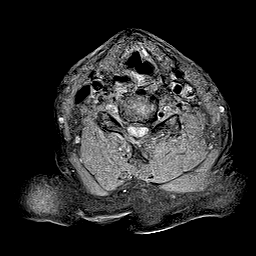
[im 14/26]
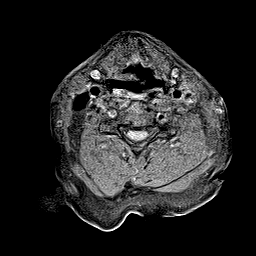
[im 17/26]
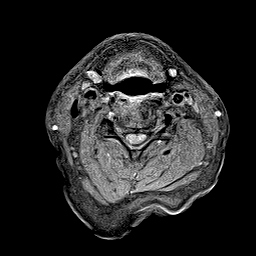
[im 23/26]
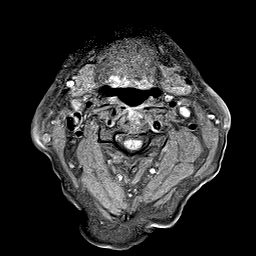
[im 26/26]
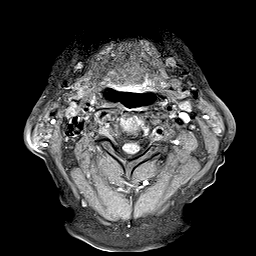

[Series 15: T1 fat-sat post-contrast · sagittal · 3.0mm · 0.78mm/px · 4 of 11 slices shown (1 of 2)]
[im 1/11]
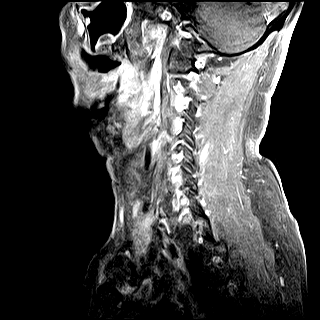
[im 4/11]
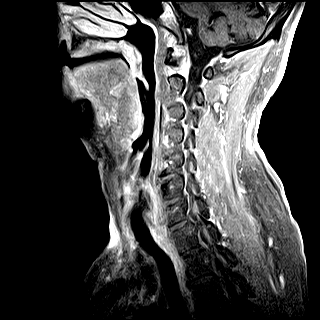
[im 7/11]
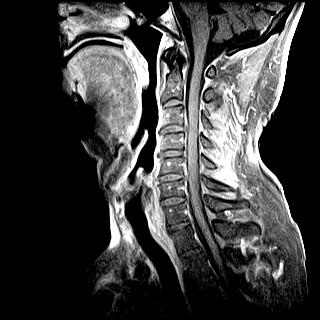
[im 11/11]
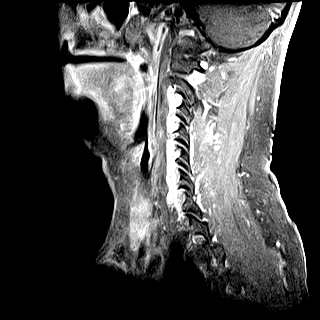

[Series 16: T1 fat-sat post-contrast · axial · 4.0mm · 0.70mm/px · z∈[-70,-26]mm · 4 of 26 slices shown (2 of 2)]
[im 1/26]
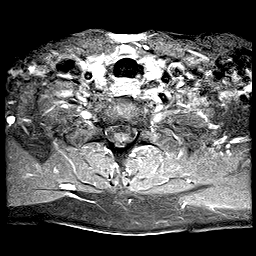
[im 3/26]
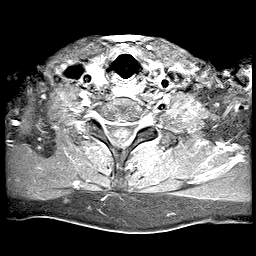
[im 9/26]
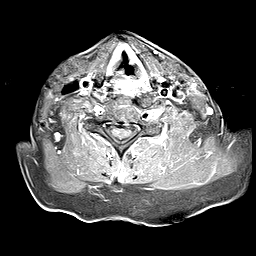
[im 12/26]
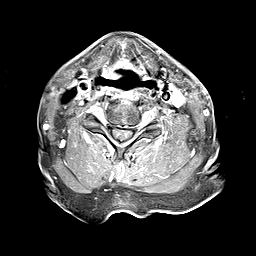

[Series 17: T2 · axial · 4.0mm · 0.40mm/px · z∈[-66,+25]mm · 9 of 24 slices shown (2 of 2)]
[im 1/24]
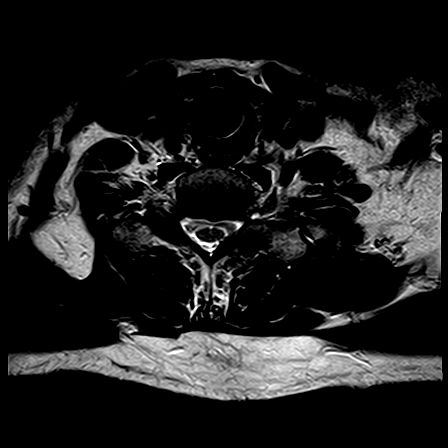
[im 3/24]
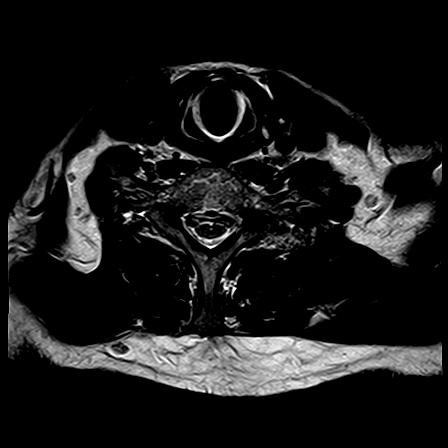
[im 6/24]
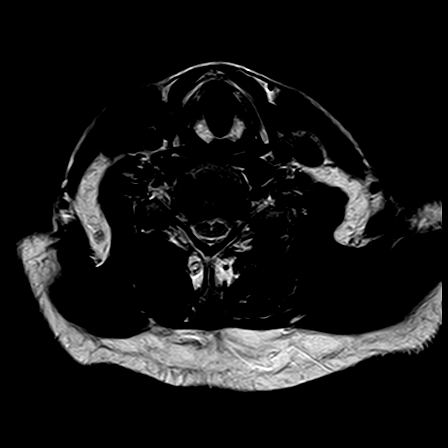
[im 9/24]
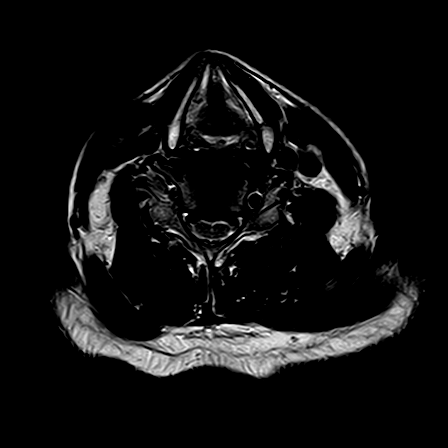
[im 12/24]
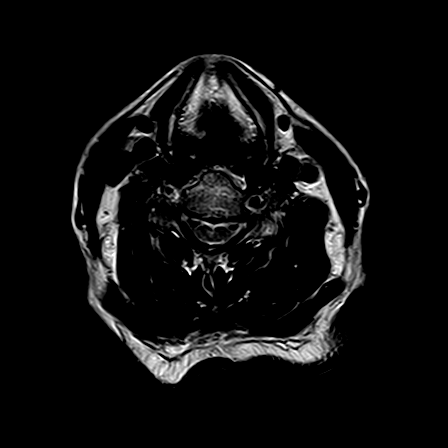
[im 15/24]
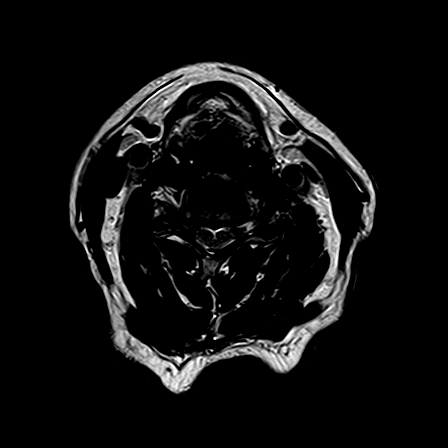
[im 18/24]
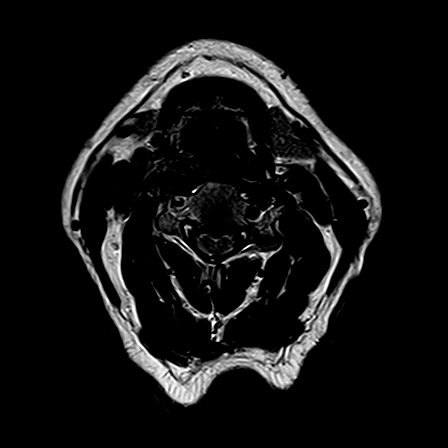
[im 21/24]
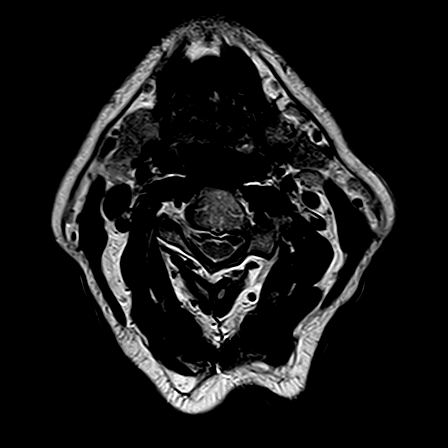
[im 24/24]
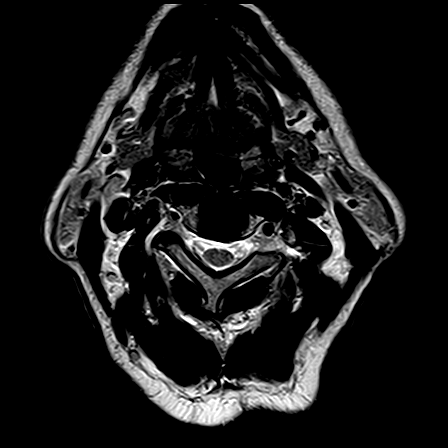

[36 of 48 positions shown; findings below may reference images not displayed]

FINDINGS: Vertebral bodies are normal in height, alignment and signal intensity. There is no acute fracture or subluxation. Visualized spinal cord is normal in signal intensity without definite evidence of demyelinating disease or compression at any level.

C2-3 level is unremarkable. 

At C3-4 level, there is a small broad-based central disc osteophyte complex with near complete effacement of the ventral CSF.  There is moderate to severe right and mild left neural foraminal stenosis from facet and uncovertebral joint hypertrophy.

At C4-5 level, there is a small broad-based central disc osteophyte complex with near complete effacement of the ventral CSF. There is moderate to severe right neural foraminal stenosis from facet and uncovertebral joint hypertrophy. 

At C5-6 level, there is a small broad-based central disc osteophyte complex partially effacing the ventral CSF. There is moderate to severe left neural foraminal stenosis from facet and uncovertebral joint hypertrophy. 

At C6-7 level, there is a small broad-based central disc osteophyte complex partially effacing the ventral CSF. There is moderate to severe bilateral neural foraminal stenosis from facet and uncovertebral joint hypertrophy. 

C7-T1 level and paraspinal soft tissues are unremarkable. Following intravenous contrast administration, there is no abnormal spinal cord or paraspinal soft tissue enhancement.
IMPRESSION: 1. No evidence of cord compression or demyelinating disease.  

2. Near complete effacement of the ventral CSF at C3-4 and C4-5 levels from small disc osteophyte complexes. 

3. Multilevel neural foraminal stenosis as detailed above.

## 2021-11-16 IMAGING — MR MRI THORACIC SPINE WITHOUT AND WITH CONTRAST
5 of 8 series · 30 of 48 positions shown · IV contrast (Gadavist)
Comparison: None available.

﻿EXAM:  [id number]   MRI THORACIC SPINE WITHOUT AND WITH CONTRAST
INDICATION: Difficulty walking, history of multiple sclerosis.
TECHNIQUE: Multiplanar multisequential MRI of the thoracic spine was performed without and with 10 mL of Gadavist.

[Series 5: T2 · sagittal · 4.0mm · 0.78mm/px · 6 of 13 slices shown (1 of 2)]
[im 1/13]
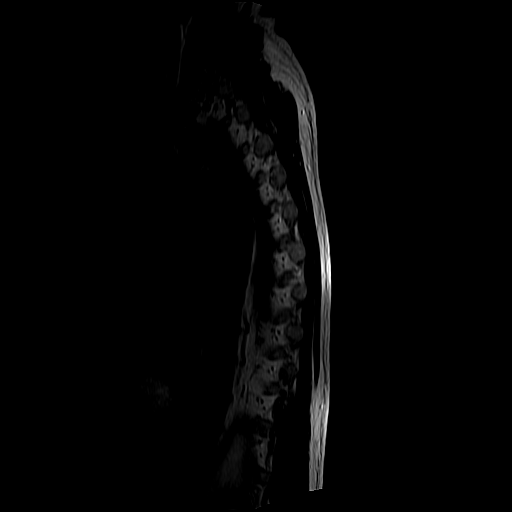
[im 3/13]
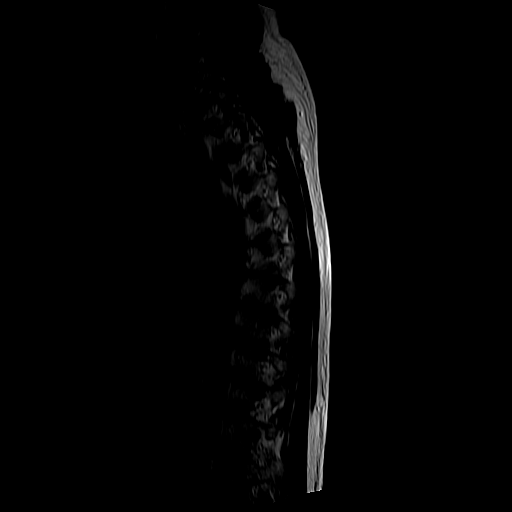
[im 5/13]
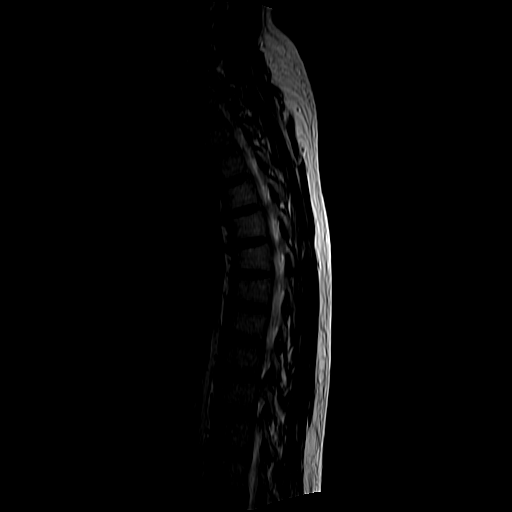
[im 8/13]
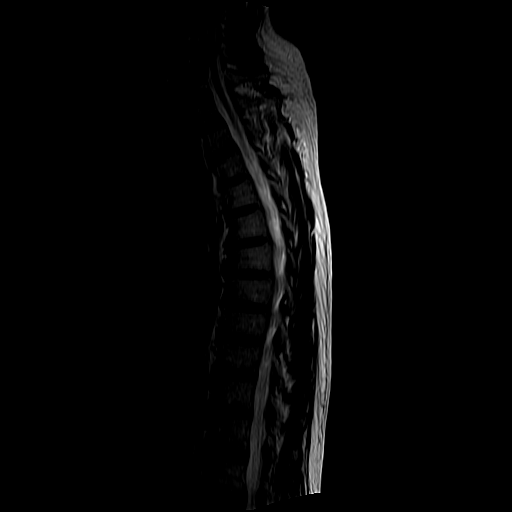
[im 10/13]
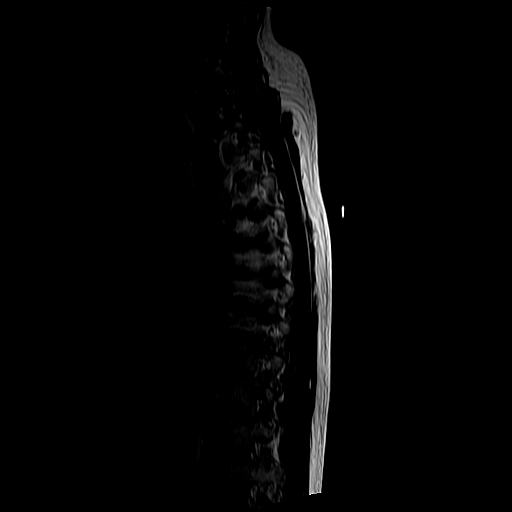
[im 13/13]
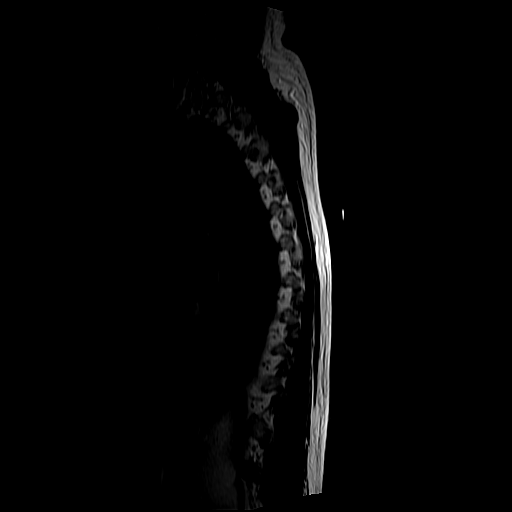

[Series 6: T1 · sagittal · 4.0mm · 0.78mm/px · 6 of 13 slices shown]
[im 1/13]
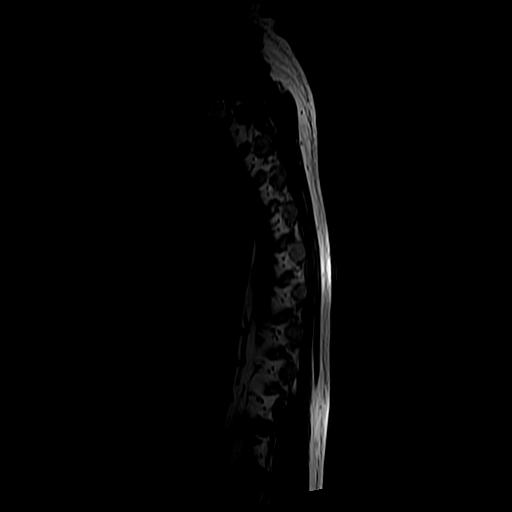
[im 3/13]
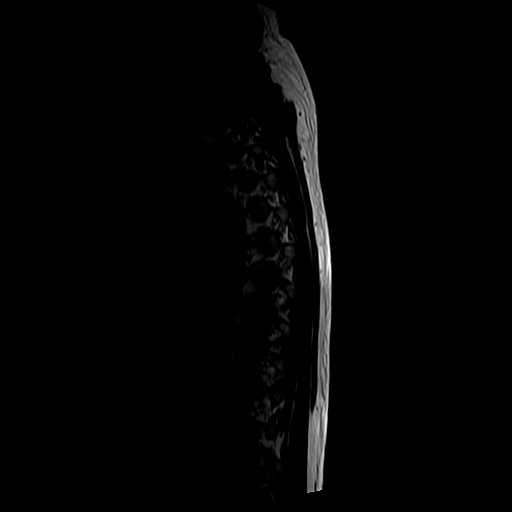
[im 5/13]
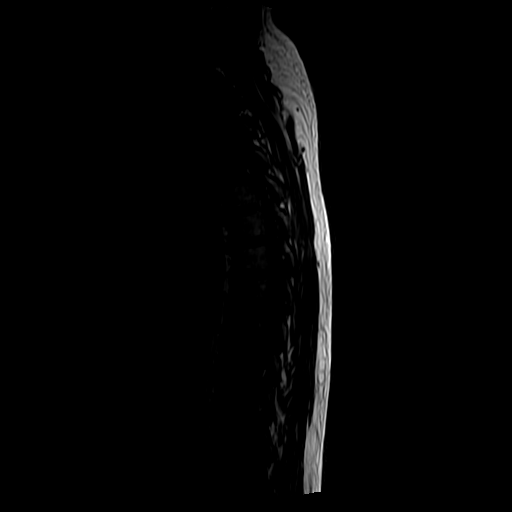
[im 8/13]
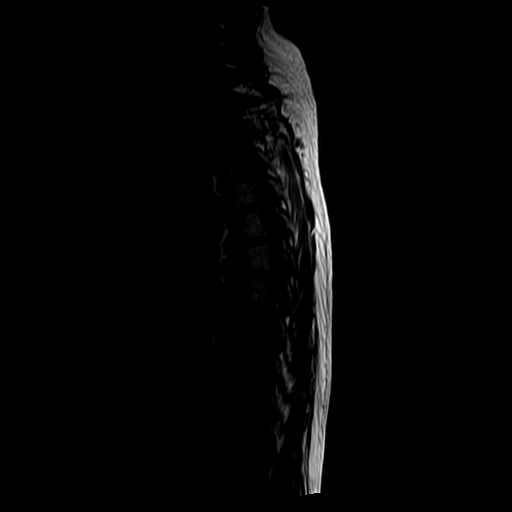
[im 10/13]
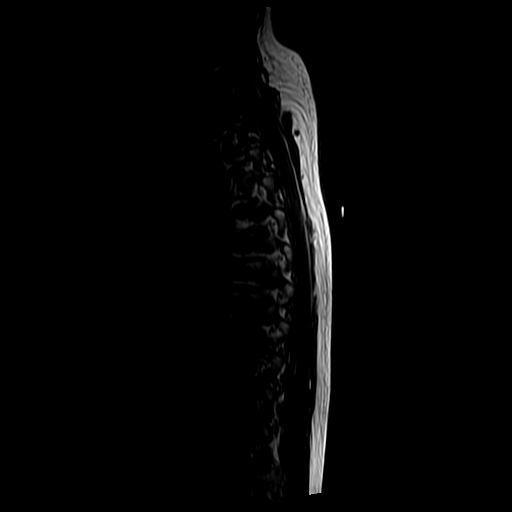
[im 13/13]
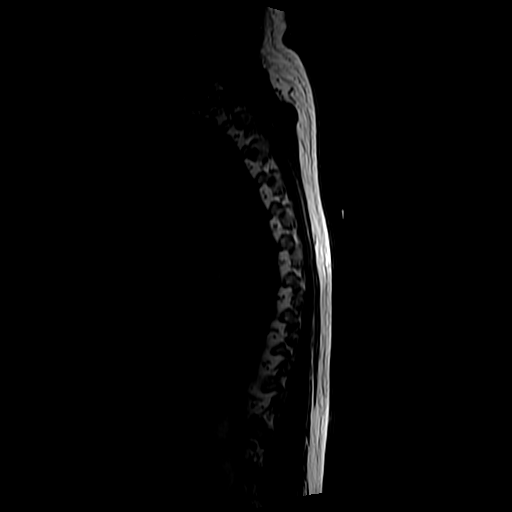

[Series 9: T1 fat-sat · sagittal · 4.0mm · 1.04mm/px · 6 of 13 slices shown (1 of 2)]
[im 1/13]
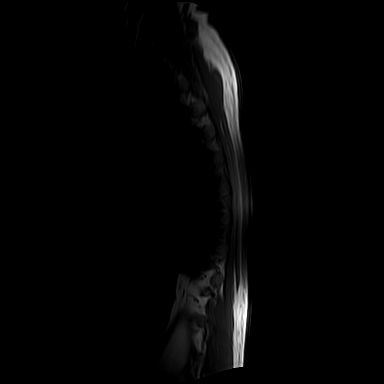
[im 3/13]
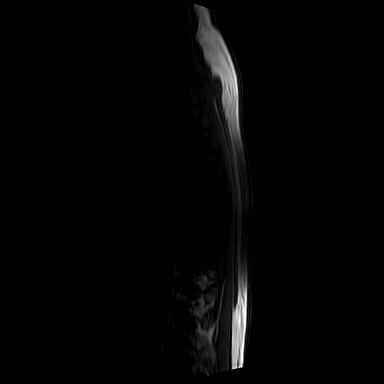
[im 5/13]
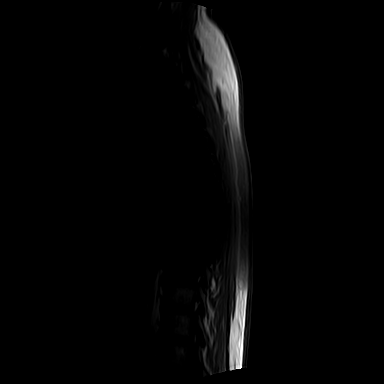
[im 8/13]
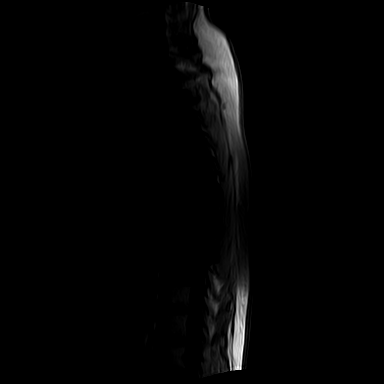
[im 10/13]
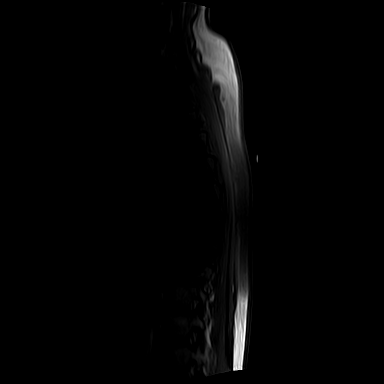
[im 13/13]
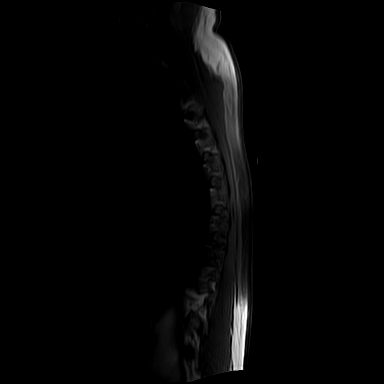

[Series 10: T2 · axial · 4.0mm · 0.62mm/px · z∈[-311,-96]mm · 6 of 12 slices shown (2 of 2)]
[im 1/12]
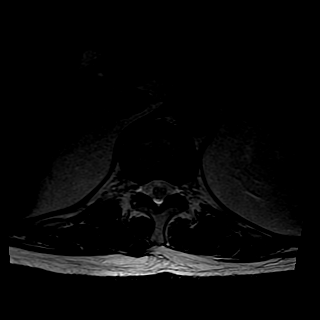
[im 3/12]
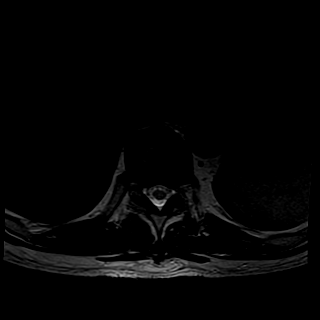
[im 5/12]
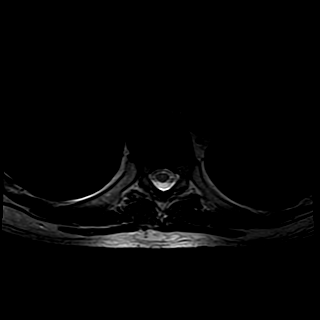
[im 7/12]
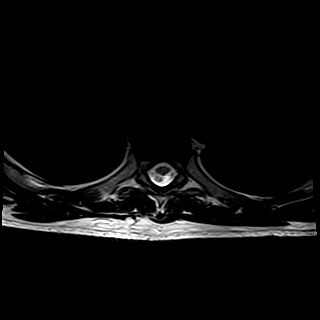
[im 9/12]
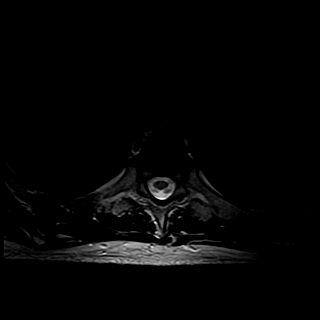
[im 12/12]
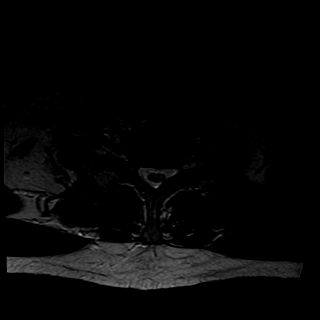

[Series 11: T1 fat-sat · axial · 4.0mm · 0.78mm/px · z∈[-311,-96]mm · 6 of 12 slices shown (2 of 2)]
[im 1/12]
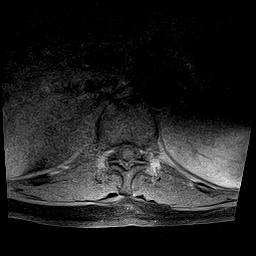
[im 3/12]
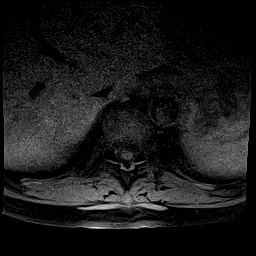
[im 5/12]
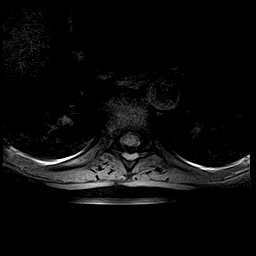
[im 7/12]
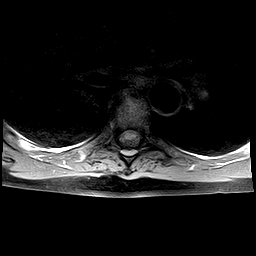
[im 9/12]
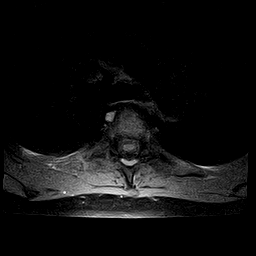
[im 12/12]
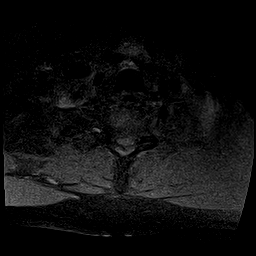

[30 of 48 positions shown; findings below may reference images not displayed]

FINDINGS: Vertebral bodies are normal in height, alignment and signal intensity. There is no acute fracture or subluxation. Visualized spinal cord is normal in signal intensity without evidence of compression or demyelinating disease at any level. 

There is no significant disc herniation, spinal canal or neural foraminal stenosis at any level. Following intravenous contrast administration, there is no abnormal spinal cord or paraspinal soft tissue enhancement. 

Paraspinal soft tissues are also unremarkable. There is no pleural effusion.
IMPRESSION: Unremarkable exam.

## 2022-04-28 IMAGING — MR MRI BRAIN WITHOUT AND WITH CONTRAST
12 of 14 series · 39 of 48 positions shown · IV contrast (gadavist)
Comparison: October 12, 2021.

﻿EXAM:  [id number]   MRI BRAIN WITHOUT AND WITH CONTRAST
INDICATION: MS follow-up.
TECHNIQUE: Multiplanar, multisequence MRI was performed both before and after the intravenous administration of 10 mL Gadavist.

[Series 4: s-map · sagittal · 9.4mm · 4.69mm/px · 3 of 64 slices shown]
[im 1/64]
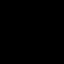
[im 13/64]
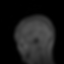
[im 26/64]
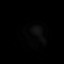

[Series 5: DWI · axial · 5.0mm · 1.35mm/px · z∈[-55,+71]mm · 8 of 88 slices shown (1 of 3)]
[im 1/88]
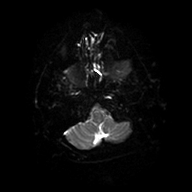
[im 13/88]
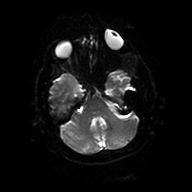
[im 25/88]
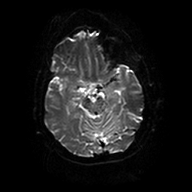
[im 38/88]
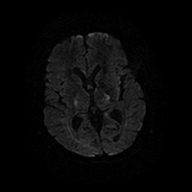
[im 50/88]
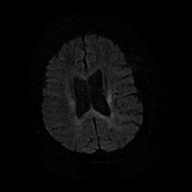
[im 63/88]
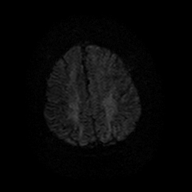
[im 75/88]
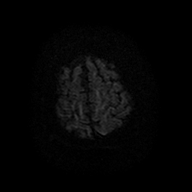
[im 88/88]
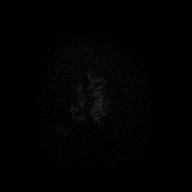

[Series 6: DWI · axial · 5.0mm · 1.35mm/px · z∈[-55,+71]mm · 2 of 22 slices shown (2 of 3)]
[im 1/22]
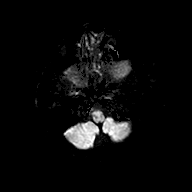
[im 22/22]
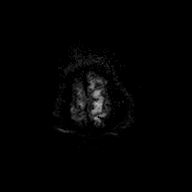

[Series 7: DWI · axial · 5.0mm · 1.35mm/px · z∈[-55,+71]mm · 2 of 22 slices shown (3 of 3)]
[im 1/22]
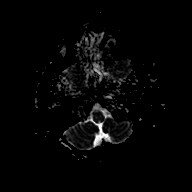
[im 22/22]
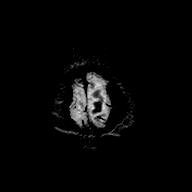

[Series 8: FLAIR · sagittal · 5.0mm · 0.75mm/px · 3 of 32 slices shown (1 of 2)]
[im 1/32]
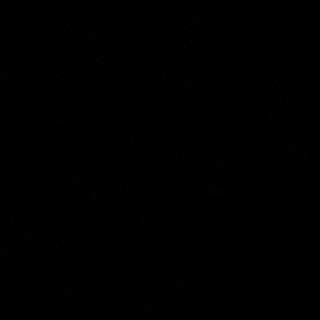
[im 16/32]
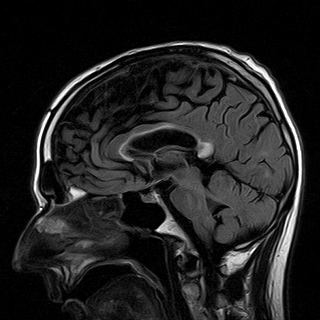
[im 32/32]
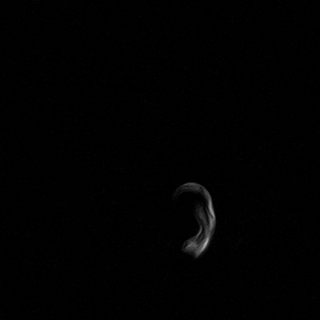

[Series 11: T1 · axial · 4.0mm · 0.69mm/px · z∈[-80,+74]mm · 3 of 36 slices shown (1 of 2)]
[im 1/36]
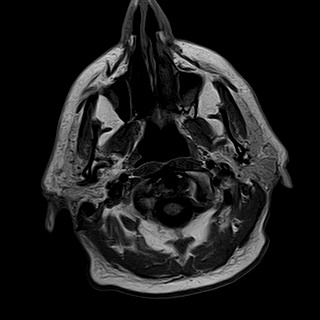
[im 18/36]
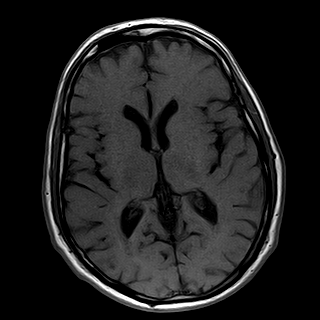
[im 36/36]
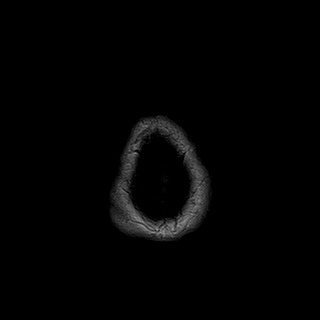

[Series 12: T2 · coronal · 5.0mm · 0.43mm/px · 3 of 28 slices shown]
[im 1/28]
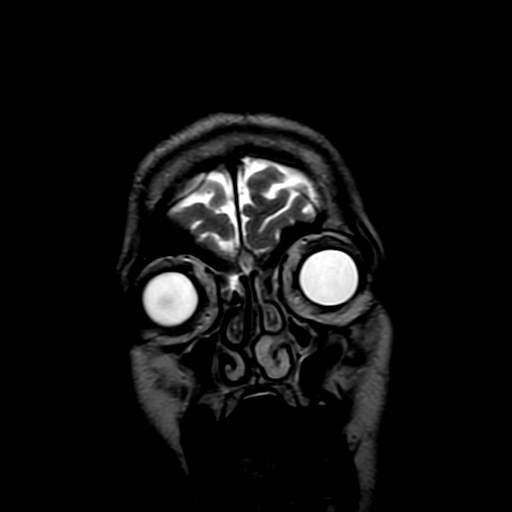
[im 14/28]
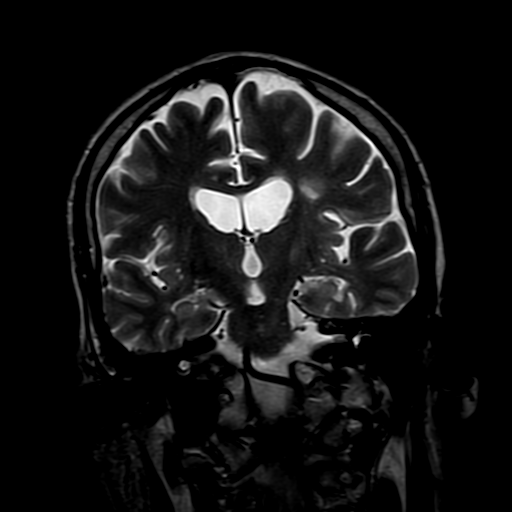
[im 28/28]
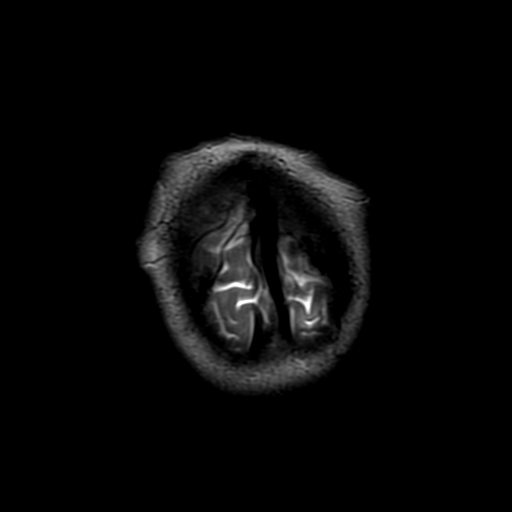

[Series 13: T1 fat-sat · axial · 4.0mm · 0.43mm/px · z∈[-80,+74]mm · 3 of 36 slices shown]
[im 1/36]
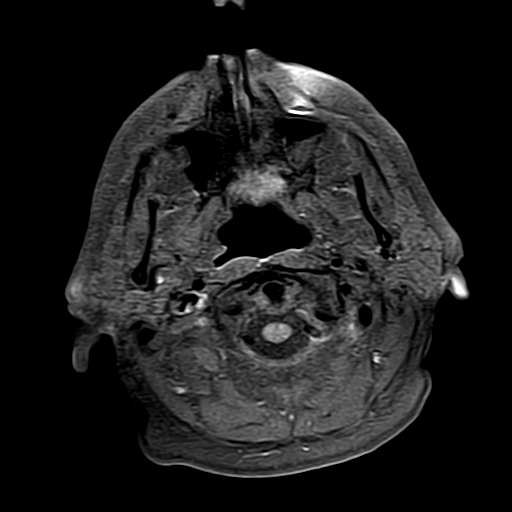
[im 18/36]
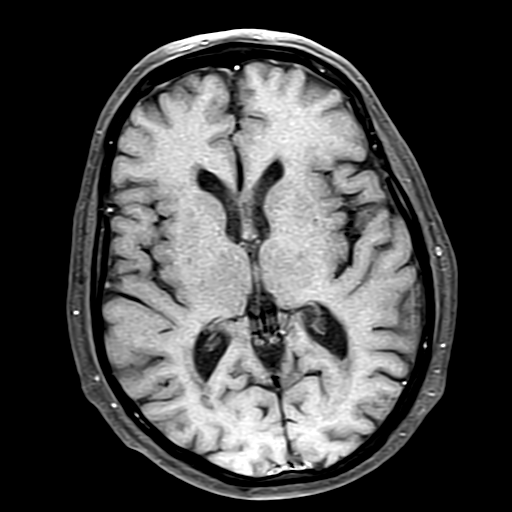
[im 36/36]
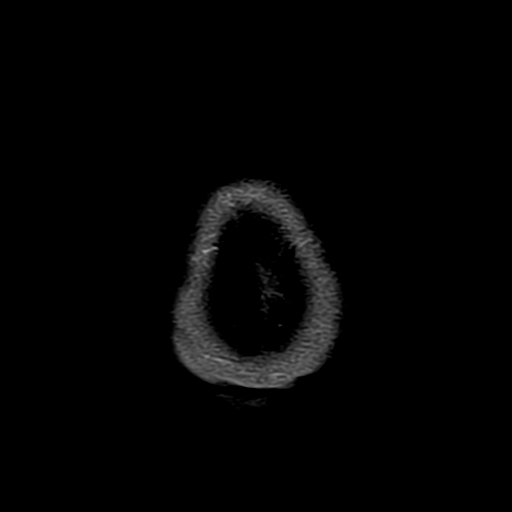

[Series 14: FLAIR · axial · 4.0mm · 0.43mm/px · z∈[-80,+74]mm · 3 of 36 slices shown (2 of 2)]
[im 1/36]
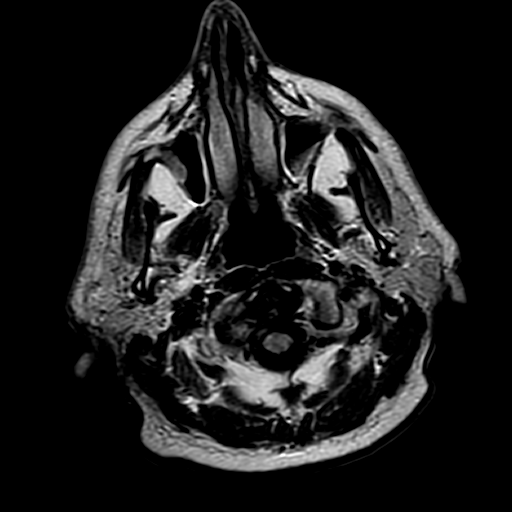
[im 18/36]
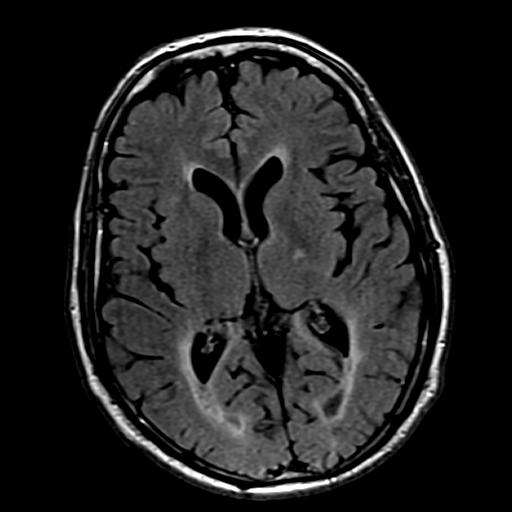
[im 36/36]
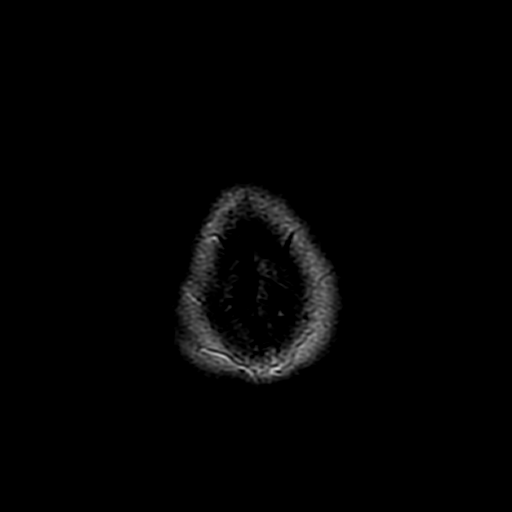

[Series 15: T1 fat-sat post-contrast · coronal · 5.0mm · 0.57mm/px · 3 of 28 slices shown (1 of 2)]
[im 1/28]
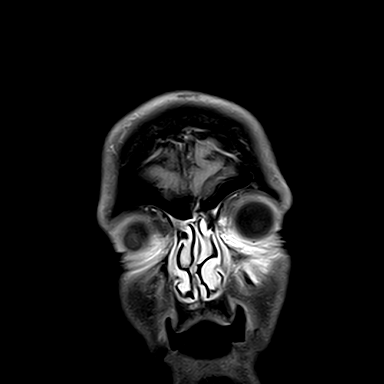
[im 14/28]
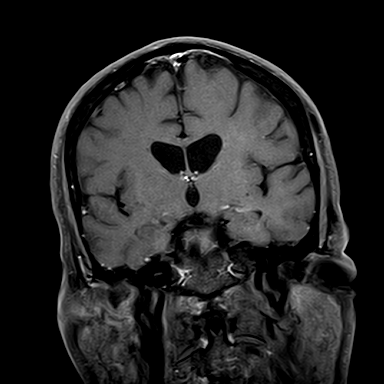
[im 28/28]
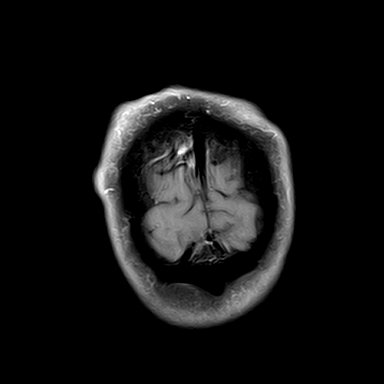

[Series 16: T1 fat-sat post-contrast · axial · 4.0mm · 0.43mm/px · z∈[-80,+74]mm · 3 of 36 slices shown (2 of 2)]
[im 1/36]
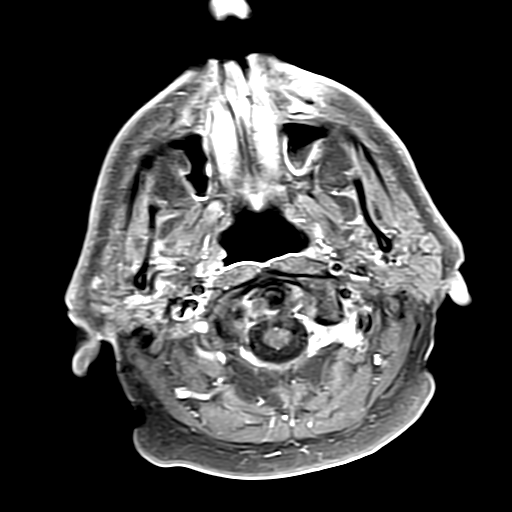
[im 18/36]
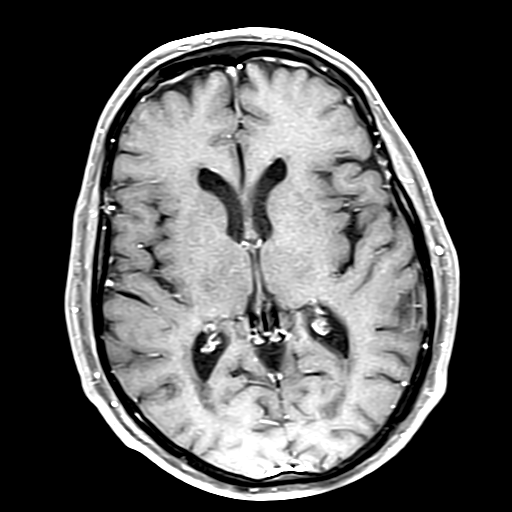
[im 36/36]
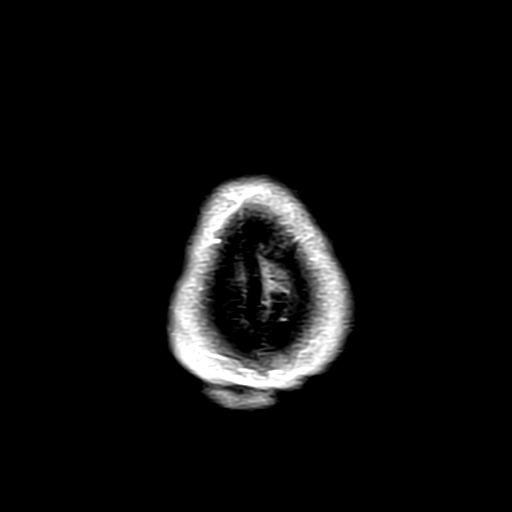

[Series 17: T1 · axial · 4.0mm · 0.69mm/px · z∈[-80,+74]mm · 3 of 36 slices shown (2 of 2)]
[im 1/36]
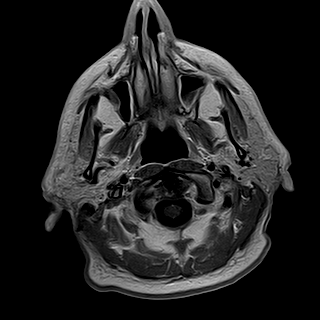
[im 18/36]
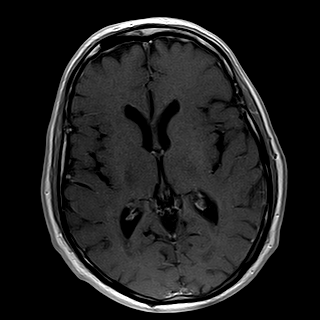
[im 36/36]
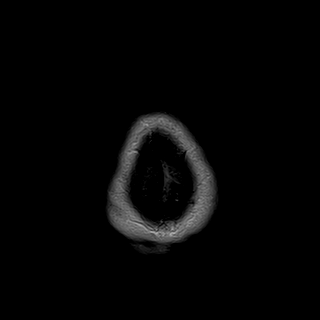

[39 of 48 positions shown; findings below may reference images not displayed]

FINDINGS: There is moderately severe multifocal white matter disease with an appearance compatible with multiple sclerosis that appears unchanged compared to the prior exam dated October 12, 2021.  There is extensive involvement of both hemispheres as well as moderate involvement of the corpus callosum and mild involvement of the brainstem.  No cerebellar involvement is seen.  

There is no abnormal enhancement.  

There is mild atrophy.  There is no evidence of mass effect, extra-axial collection, or shift of the midline structures.  Chronic bilateral maxillary sinusitis is noted incidentally.
IMPRESSION: Moderately severe multiple sclerosis without significant change.

## 2023-05-12 IMAGING — MR MRI BRAIN W/O CONTRAST
8 of 10 series · 34 of 48 positions shown · non-contrast
Comparison: MRI brain dated 04/28/2022.

﻿EXAM:  MRI BRAIN W/O CONTRAST
INDICATION: Follow-up multiple sclerosis.
TECHNIQUE: Multiplanar, multisequential MRI of the brain was performed without administration of contrast.

[Series 5: DWI · axial · 6.0mm · 1.35mm/px · z∈[-40,+121]mm · 11 of 96 slices shown (1 of 3)]
[im 1/96]
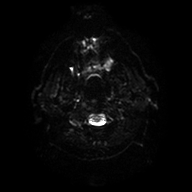
[im 10/96]
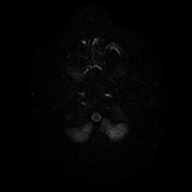
[im 20/96]
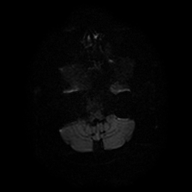
[im 29/96]
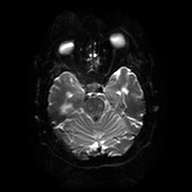
[im 39/96]
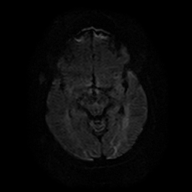
[im 48/96]
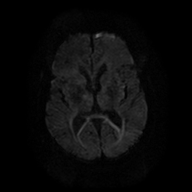
[im 58/96]
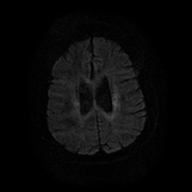
[im 67/96]
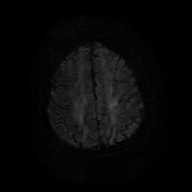
[im 77/96]
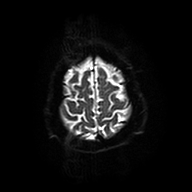
[im 86/96]
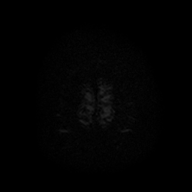
[im 96/96]
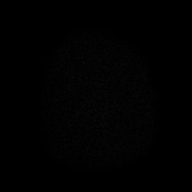

[Series 6: DWI · axial · 6.0mm · 1.35mm/px · z∈[-40,+121]mm · 2 of 24 slices shown (2 of 3)]
[im 1/24]
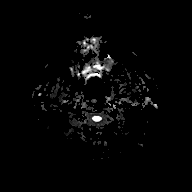
[im 24/24]
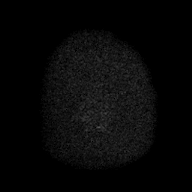

[Series 7: DWI · axial · 6.0mm · 1.35mm/px · z∈[-40,+121]mm · 3 of 24 slices shown (3 of 3)]
[im 1/24]
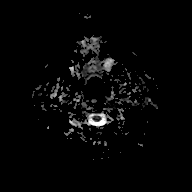
[im 12/24]
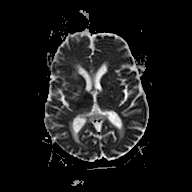
[im 24/24]
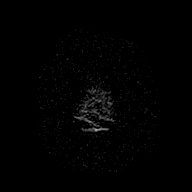

[Series 8: FLAIR · sagittal · 5.0mm · 0.75mm/px · 3 of 26 slices shown (1 of 2)]
[im 1/26]
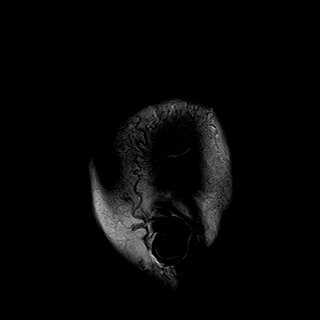
[im 13/26]
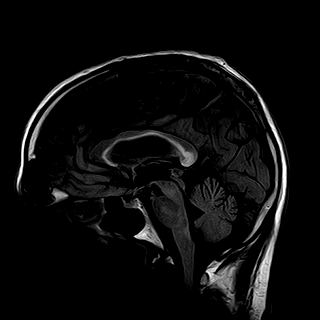
[im 26/26]
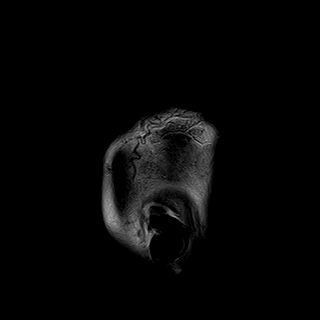

[Series 9: PD · axial · 4.5mm · 0.43mm/px · 1 of 36 slices shown]
[im 1/36]
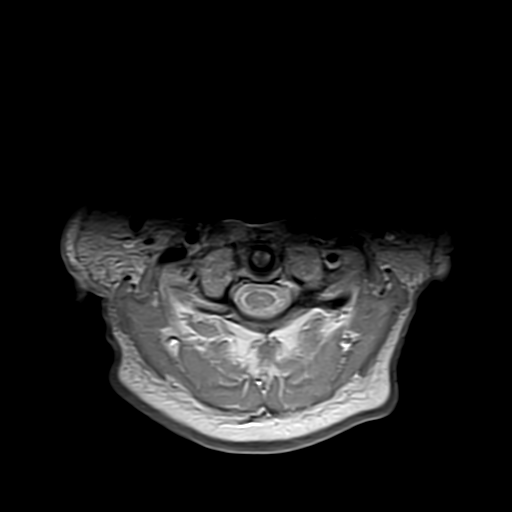

[Series 11: FLAIR · axial · 4.5mm · 0.43mm/px · z∈[-56,+114]mm · 5 of 36 slices shown (2 of 2)]
[im 1/36]
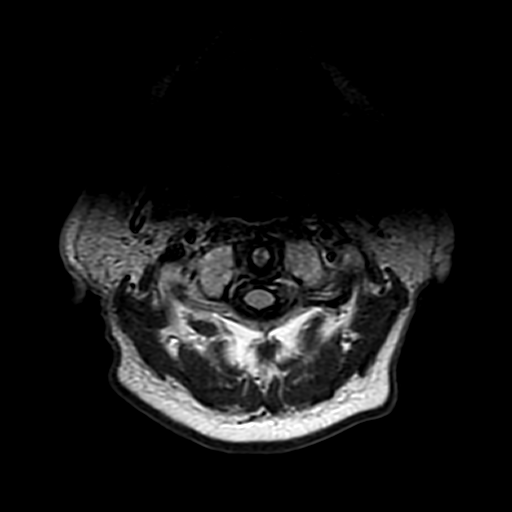
[im 9/36]
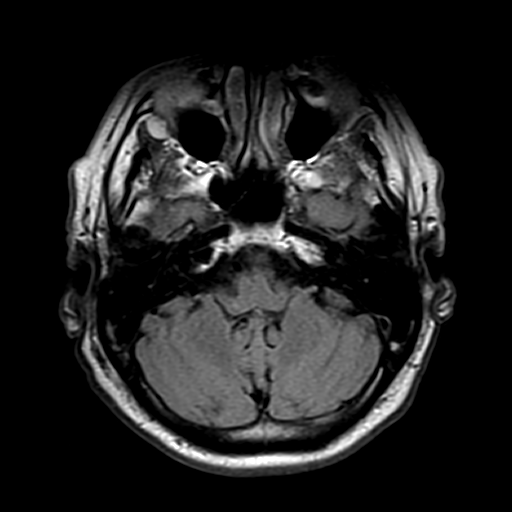
[im 18/36]
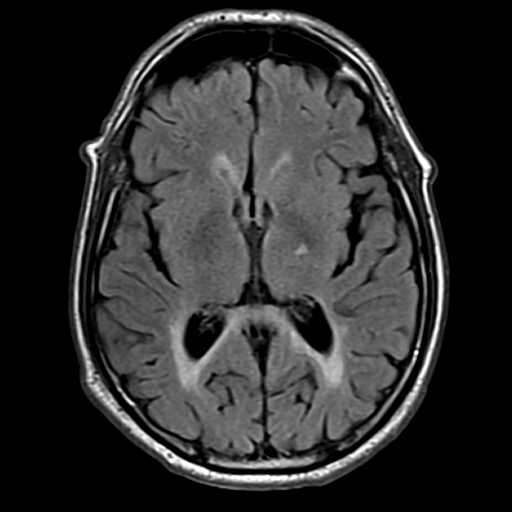
[im 27/36]
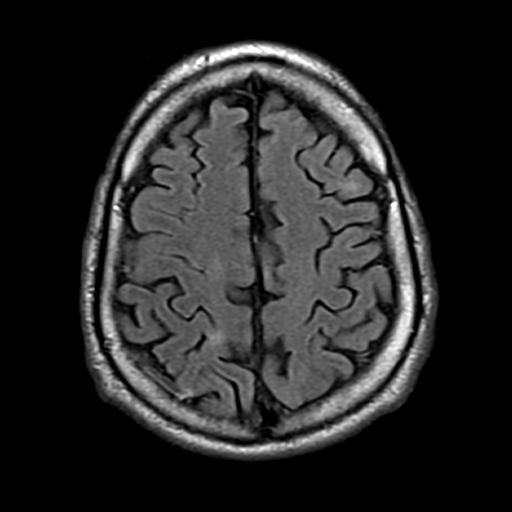
[im 36/36]
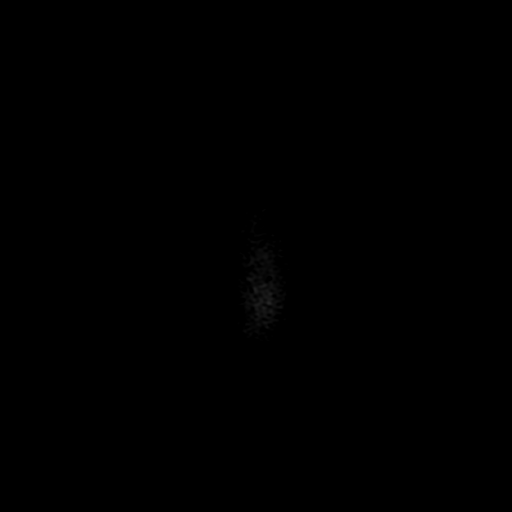

[Series 13: T1 · axial · 4.5mm · 0.43mm/px · z∈[-53,+117]mm · 5 of 36 slices shown]
[im 1/36]
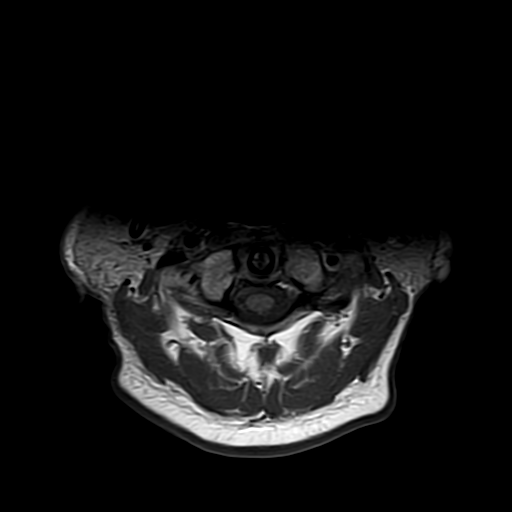
[im 9/36]
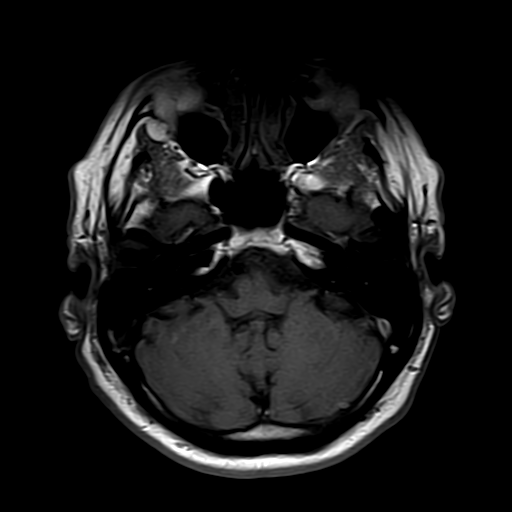
[im 18/36]
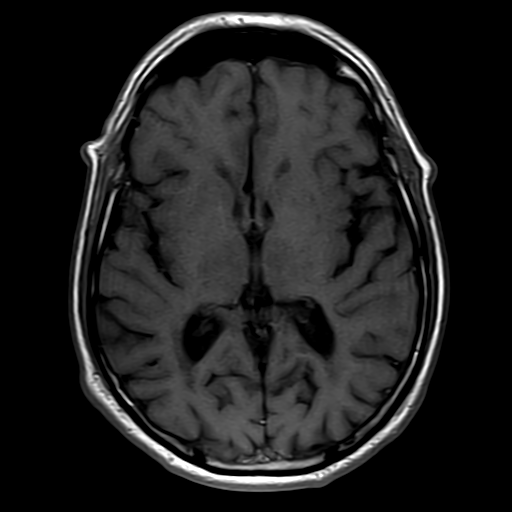
[im 27/36]
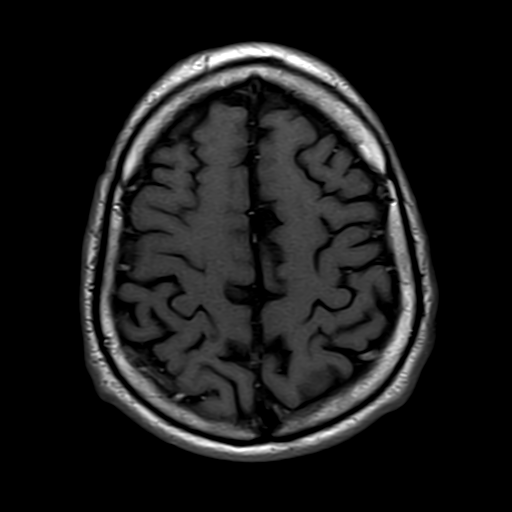
[im 36/36]
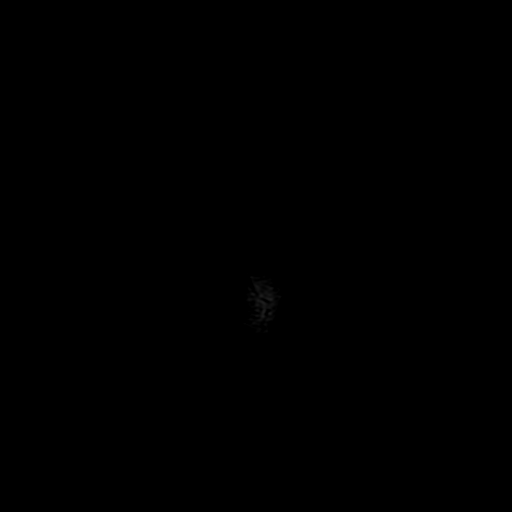

[Series 14: T2 · coronal · 6.0mm · 0.43mm/px · 4 of 28 slices shown]
[im 1/28]
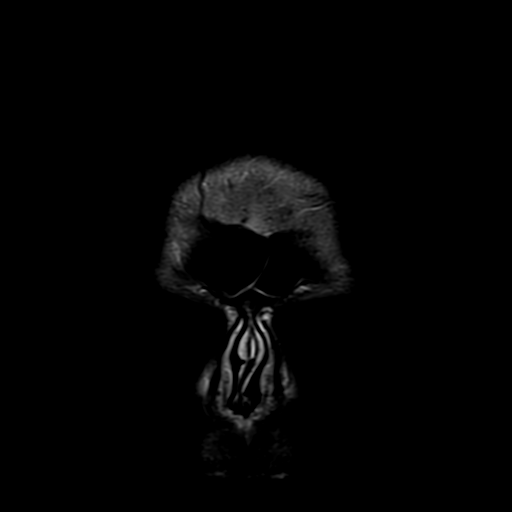
[im 10/28]
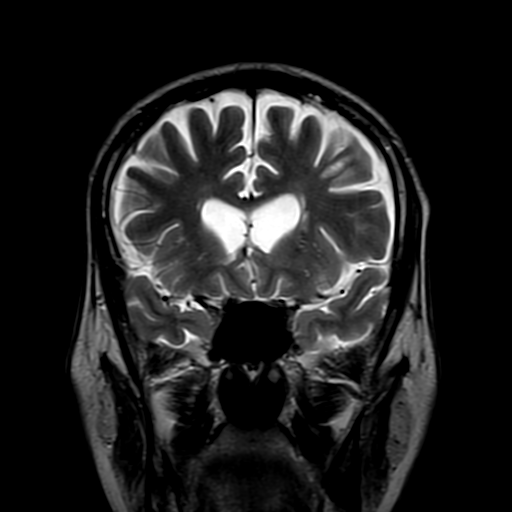
[im 19/28]
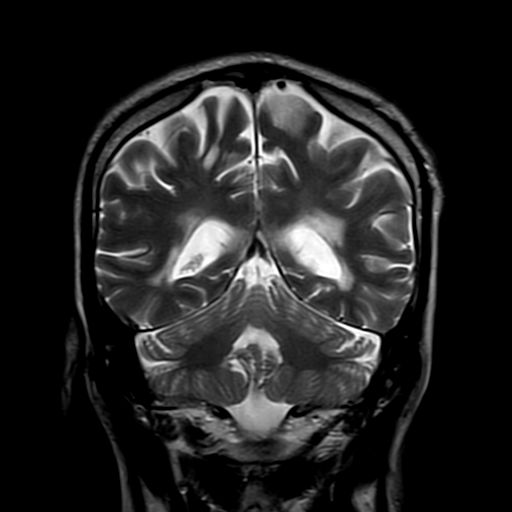
[im 28/28]
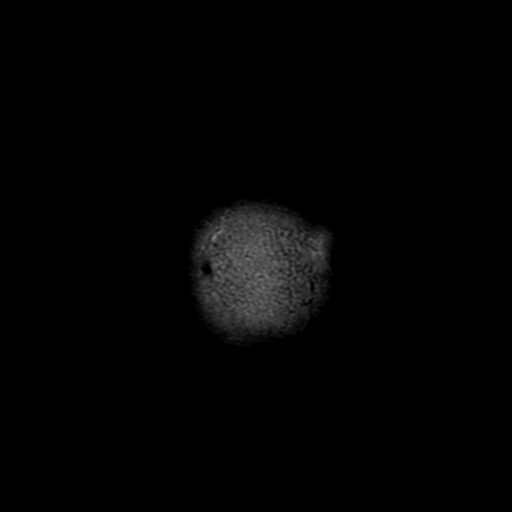

[34 of 48 positions shown; findings below may reference images not displayed]

FINDINGS: No focal areas of restricted diffusion.  No intracranial bleed or extra-axial collections are seen.

Extensive T2 FLAIR signal abnormality of periventricular white matter are noted with large demyelinating lesions on both sides including subcortical white matter.  Significant FLAIR signal abnormality of the corpus callosum is noted.  Faintly defined FLAIR signal abnormality of mid pons is noted.  No FLAIR signal lesions of the cerebellum are seen.  Upper cervical cord up to C2 level shows no focal abnormalities.

Mild symmetric global cerebral atrophy.  No midline shift.
IMPRESSION: 1. Stable, extensive demyelinating lesions of the brain involving periventricular and subcortical white matter on both sides.

2. Stable abnormal signal of the corpus callosum on the inferior aspect. Stable faintly defined FLAIR signal lesions of the mid pons on both sides of the midline.

4. Mild cerebral cortical atrophy.  Overall findings unchanged from 10/12/2021 and 04/28/2022.  

Electronically Signed by MUNIVE, OSCKAR at [id number] [DATE]

## 2023-12-05 ENCOUNTER — Encounter (INDEPENDENT_AMBULATORY_CARE_PROVIDER_SITE_OTHER): Payer: Self-pay
